# Patient Record
Sex: Female | Born: 1935 | Race: White | Hispanic: No | Marital: Married | Smoking: Never smoker
Health system: Southern US, Community
[De-identification: ages and names within clinical notes are randomized; demographics above are authoritative.]

## PROBLEM LIST (undated history)

## (undated) DIAGNOSIS — E079 Disorder of thyroid, unspecified: Secondary | ICD-10-CM

---

## 2004-11-22 ENCOUNTER — Other Ambulatory Visit: Payer: Self-pay

## 2004-11-22 ENCOUNTER — Inpatient Hospital Stay: Payer: Self-pay | Admitting: Internal Medicine

## 2005-05-08 ENCOUNTER — Ambulatory Visit: Payer: Self-pay | Admitting: Vascular Surgery

## 2005-05-09 ENCOUNTER — Inpatient Hospital Stay: Payer: Self-pay | Admitting: Vascular Surgery

## 2005-10-24 ENCOUNTER — Ambulatory Visit: Payer: Self-pay | Admitting: Internal Medicine

## 2006-01-30 ENCOUNTER — Ambulatory Visit: Payer: Self-pay | Admitting: Internal Medicine

## 2007-12-06 ENCOUNTER — Ambulatory Visit: Payer: Self-pay | Admitting: Internal Medicine

## 2008-05-19 ENCOUNTER — Emergency Department: Payer: Self-pay | Admitting: Unknown Physician Specialty

## 2009-03-04 ENCOUNTER — Ambulatory Visit: Payer: Self-pay | Admitting: Internal Medicine

## 2009-03-11 ENCOUNTER — Ambulatory Visit: Payer: Self-pay | Admitting: Internal Medicine

## 2010-04-18 ENCOUNTER — Emergency Department: Payer: Self-pay | Admitting: Emergency Medicine

## 2010-04-23 ENCOUNTER — Ambulatory Visit: Payer: Self-pay | Admitting: Internal Medicine

## 2010-04-29 ENCOUNTER — Ambulatory Visit: Payer: Self-pay | Admitting: Internal Medicine

## 2011-01-23 ENCOUNTER — Emergency Department: Payer: Self-pay | Admitting: Emergency Medicine

## 2011-03-22 ENCOUNTER — Ambulatory Visit: Payer: Self-pay | Admitting: Internal Medicine

## 2011-12-11 ENCOUNTER — Ambulatory Visit: Payer: Self-pay | Admitting: Internal Medicine

## 2012-05-22 ENCOUNTER — Ambulatory Visit: Payer: Self-pay | Admitting: Internal Medicine

## 2013-05-12 ENCOUNTER — Ambulatory Visit: Payer: Self-pay | Admitting: Family Medicine

## 2018-06-15 ENCOUNTER — Emergency Department: Payer: Medicare Other

## 2018-06-15 ENCOUNTER — Emergency Department
Admission: EM | Admit: 2018-06-15 | Discharge: 2018-06-15 | Disposition: A | Discharge status: Home / Self Care | Payer: Medicare Other | Attending: Emergency Medicine | Admitting: Emergency Medicine

## 2018-06-15 DIAGNOSIS — R0602 Shortness of breath: Secondary | ICD-10-CM

## 2018-06-15 DIAGNOSIS — Z7982 Long term (current) use of aspirin: Secondary | ICD-10-CM | POA: Insufficient documentation

## 2018-06-15 DIAGNOSIS — Z79899 Other long term (current) drug therapy: Secondary | ICD-10-CM | POA: Insufficient documentation

## 2018-06-15 HISTORY — DX: Disorder of thyroid, unspecified: E07.9

## 2018-06-15 LAB — BASIC METABOLIC PANEL
ANION GAP: 8 (ref 5–15)
BUN: 22 mg/dL (ref 8–23)
CO2: 26 mmol/L (ref 22–32)
Calcium: 9.8 mg/dL (ref 8.9–10.3)
Chloride: 106 mmol/L (ref 98–111)
Creatinine, Ser: 1.07 mg/dL — ABNORMAL HIGH (ref 0.44–1.00)
GFR calc Af Amer: 50 mL/min — ABNORMAL LOW (ref >60)
GFR, EST NON AFRICAN AMERICAN: 43 mL/min — AB (ref >60)
Glucose, Bld: 110 mg/dL — ABNORMAL HIGH (ref 70–99)
POTASSIUM: 3.5 mmol/L (ref 3.5–5.1)
SODIUM: 140 mmol/L (ref 135–145)

## 2018-06-15 LAB — CBC
HEMATOCRIT: 32.9 % — AB (ref 35.0–47.0)
HEMOGLOBIN: 11.2 g/dL — AB (ref 12.0–16.0)
MCH: 30.7 pg (ref 26.0–34.0)
MCHC: 33.9 g/dL (ref 32.0–36.0)
MCV: 90.5 fL (ref 80.0–100.0)
Platelets: 260 10*3/uL (ref 150–440)
RBC: 3.64 MIL/uL — ABNORMAL LOW (ref 3.80–5.20)
RDW: 14.3 % (ref 11.5–14.5)
WBC: 5.8 10*3/uL (ref 3.6–11.0)

## 2018-06-15 LAB — TSH: TSH: 3.681 u[IU]/mL (ref 0.350–4.500)

## 2018-06-15 LAB — TROPONIN I
Troponin I: <0.03 ng/mL (ref <0.03)
Troponin I: <0.03 ng/mL (ref <0.03)

## 2018-06-15 MED ORDER — GI COCKTAIL ~~LOC~~
30.0000 mL | Freq: Once | ORAL | Status: AC
  Administered 2018-06-15: 30 mL via ORAL
  Filled 2018-06-15: qty 30

## 2018-06-15 NOTE — Discharge Instructions (Addendum)
Avoid the heat, return to the emergency room for any new or worrisome symptoms.

## 2018-06-15 NOTE — ED Notes (Signed)
Patient states that her heartburn was relieved by the GI Cocktail.

## 2018-06-15 NOTE — ED Provider Notes (Addendum)
[facility] Emergency Department Provider Note  ____________________________________________   I have reviewed the triage vital signs and the nursing notes. Where available I have reviewed prior notes and, if possible and indicated, outside hospital notes.    HISTORY  Chief Complaint Shortness of Breath and Chest Pain    HPI Ann Green is a 84 y.o. female  With a history of thyroid disease, states that she was outside on her porch in the heat, is a very high heat index day, and she became a little overwhelmed.  She had some shortness of breath and felt uncontrolled.  She did not have chest pain she describes to me.  In any event, she came into the house and started feeling better EMS was called and when she got into the ambulance she felt much better and at this time she feels back to normal.  Her strong preference is not to be admitted to the hospital.  She denies any leg swelling, fever chills or pleuritic chest pain  At this time she is resting comfortably and her only desires to be left alone and go home   Past Medical History:  Diagnosis Date  . Thyroid disease     There are no active problems to display for this patient.   History reviewed. No pertinent surgical history.  Prior to Admission medications   Medication Sig Start Date End Date Taking? Authorizing Provider  aspirin 81 MG chewable tablet Chew 81 mg by mouth daily.   Yes [provider]  ferrous sulfate 325 (65 FE) MG tablet Take 325 mg by mouth every morning.   Yes [provider]  furosemide (LASIX) 20 MG tablet Take 10-20 mg by mouth 3 (three) times a week. (as needed for leg swelling) 06/09/18  Yes [provider]  levothyroxine (SYNTHROID, LEVOTHROID) 75 MCG tablet Take 75 mcg by mouth daily. 06/05/18  Yes [provider]  omeprazole (PRILOSEC) 20 MG capsule Take 20 mg by mouth daily. 10/21/17  Yes [provider]  venlafaxine XR  (EFFEXOR-XR) 150 MG 24 hr capsule Take 150 mg by mouth daily. 04/14/18  Yes [provider]  vitamin B-12 (CYANOCOBALAMIN) 1000 MCG tablet Take 1,000 mcg by mouth daily. 10/16/17 10/16/18 Yes [provider]    Allergies Patient has no known allergies.  History reviewed. No pertinent family history.  Social History Social History   Tobacco Use  . Smoking status: Never Smoker  . Smokeless tobacco: Never Used  Substance Use Topics  . Alcohol use: Not Currently  . Drug use: Never    Review of Systems Constitutional: No fever/chills Eyes: No visual changes. ENT: No sore throat. No stiff neck no neck pain Cardiovascular: Denies chest pain. Respiratory: Transitory shortness of breath. Gastrointestinal:   no vomiting.  No diarrhea.  No constipation. Genitourinary: Negative for dysuria. Musculoskeletal: Negative lower extremity swelling Skin: Negative for rash. Neurological: Negative for severe headaches, focal weakness or numbness.   ____________________________________________   PHYSICAL EXAM:  VITAL SIGNS: ED Triage Vitals  Enc Vitals Group     BP 06/15/18 1600 (!) 157/70     Pulse Rate 06/15/18 1600 70     Resp 06/15/18 1600 16     Temp 06/15/18 1553 98.5 F (36.9 C)     Temp src --      SpO2 06/15/18 1600 100 %     Weight 06/15/18 1555 130 lb (59 kg)     Height 06/15/18 1555 5\' 3"  (1.6 m)  Head Circumference --      Peak Flow --      Pain Score 06/15/18 1554 8     Pain Loc --      Pain Edu? --      Excl. in GC? --     Constitutional: Alert and oriented. Well appearing and in no acute distress. Eyes: Conjunctivae are normal Head: Atraumatic HEENT: No congestion/rhinnorhea. Mucous membranes are moist.  Oropharynx non-erythematous Neck:   Nontender with no meningismus, no masses, no stridor Cardiovascular: Normal rate, regular rhythm. Grossly normal heart sounds.  Good peripheral circulation. Respiratory: Normal respiratory effort.  No  retractions. Lungs CTAB. Abdominal: Soft and nontender. No distention. No guarding no rebound Back:  There is no focal tenderness or step off.  there is no midline tenderness there are no lesions noted. there is no CVA tenderness Musculoskeletal: No lower extremity tenderness, no upper extremity tenderness. No joint effusions, no DVT signs strong distal pulses no edema Neurologic:  Normal speech and language. No gross focal neurologic deficits are appreciated.  Skin:  Skin is warm, dry and intact. No rash noted. Psychiatric: Mood and affect are normal. Speech and behavior are normal.  ____________________________________________   LABS (all labs ordered are listed, but only abnormal results are displayed)  Labs Reviewed  BASIC METABOLIC PANEL - Abnormal; Notable for the following components:      Result Value   Glucose, Bld 110 (*)    Creatinine, Ser 1.07 (*)    GFR calc non Af Amer 43 (*)    GFR calc Af Amer 50 (*)    All other components within normal limits  CBC - Abnormal; Notable for the following components:   RBC 3.64 (*)    Hemoglobin 11.2 (*)    HCT 32.9 (*)    All other components within normal limits  TROPONIN I  TSH    Pertinent labs  results that were available during my care of the patient were reviewed by me and considered in my medical decision making (see chart for details). ____________________________________________  EKG  I personally interpreted any EKGs ordered by me or triage Sinus rhythm rate 73 bpm no acute ST elevation or depression normal axis unremarkable EKG ____________________________________________  RADIOLOGY  Pertinent labs & imaging results that were available during my care of the patient were reviewed by me and considered in my medical decision making (see chart for details). If possible, patient and/or family made aware of any abnormal findings.  Dg Chest 2 View  Result Date: 06/15/2018 CLINICAL DATA:  Chest pain and shortness of  breath beginning today. EXAM: CHEST - 2 VIEW COMPARISON:  05/12/2013 FINDINGS: The heart size and mediastinal contours are within normal limits. Aortic atherosclerosis. Pulmonary hyperinflation is again seen, consistent with COPD. New linear opacity is seen in the right lower lung, which may be due to scarring or atelectasis. Old bilateral rib fracture deformities are noted. IMPRESSION: New right basilar scarring versus subsegmental atelectasis. No evidence of pulmonary consolidation or edema. COPD. Electronically Signed   By: Myles Rosenthal M.D.   On: 06/15/2018 16:35   ____________________________________________    PROCEDURES  Procedure(s) performed: None  Procedures  Critical Care performed: None  ____________________________________________   INITIAL IMPRESSION / ASSESSMENT AND PLAN / ED COURSE  Pertinent labs & imaging results that were available during my care of the patient were reviewed by me and considered in my medical decision making (see chart for details).  Patient here after having a transitory episode of feeling  short of breath and somewhat overwhelmed while sitting on a porch in a 95 degree today with a very high heat index.  After being in air conditioning she feels better and she has no other complaints.  EKG blood work are reassuring chest x-ray is reassuring, no evidence of acute pathology noted and her strong preference is to go very low suspicion for PE.  Thing to suggest dissection.  We will send a second troponin as a precaution if that is okay and patient strong preference is unchanged about admission we will discharge  ----------------------------------------- 8:26 PM on 06/15/2018 -----------------------------------------  Remains absolutely symptomatic care with no evidence of ACS PE dissection myocarditis pericarditis, pneumonia pneumothorax intra-abdominal pathology, pericardial effusion or tamponade, he has been relaxing and very patiently waiting to go home.   Patient and family declined admission which is not unreasonable we will discharge with close outpatient follow-up   ____________________________________________   FINAL CLINICAL IMPRESSION(S) / ED DIAGNOSES  Final diagnoses:  None      This chart was dictated using voice recognition software.  Despite best efforts to proofread,  errors can occur which can change meaning.      Jeanmarie PlantMcShane, James A, MD 06/15/18 1756    Jeanmarie PlantMcShane, James A, MD 06/15/18 2027

## 2018-06-15 NOTE — ED Triage Notes (Signed)
Pt presents via EMS c/o SOB and chest pressure starting today.

## 2018-07-15 ENCOUNTER — Emergency Department
Admission: EM | Admit: 2018-07-15 | Discharge: 2018-07-15 | Disposition: A | Discharge status: Home / Self Care | Payer: Medicare Other | Attending: Student in an Organized Health Care Education/Training Program | Admitting: Student in an Organized Health Care Education/Training Program

## 2018-07-15 ENCOUNTER — Encounter: Payer: Self-pay | Admitting: Emergency Medicine

## 2018-07-15 ENCOUNTER — Other Ambulatory Visit: Payer: Self-pay

## 2018-07-15 ENCOUNTER — Emergency Department: Payer: Medicare Other

## 2018-07-15 DIAGNOSIS — Y999 Unspecified external cause status: Secondary | ICD-10-CM | POA: Insufficient documentation

## 2018-07-15 DIAGNOSIS — Y929 Unspecified place or not applicable: Secondary | ICD-10-CM | POA: Diagnosis not present

## 2018-07-15 DIAGNOSIS — S8991XA Unspecified injury of right lower leg, initial encounter: Secondary | ICD-10-CM | POA: Diagnosis present

## 2018-07-15 DIAGNOSIS — Z79899 Other long term (current) drug therapy: Secondary | ICD-10-CM | POA: Diagnosis not present

## 2018-07-15 DIAGNOSIS — Y9301 Activity, walking, marching and hiking: Secondary | ICD-10-CM | POA: Diagnosis not present

## 2018-07-15 DIAGNOSIS — W228XXA Striking against or struck by other objects, initial encounter: Secondary | ICD-10-CM | POA: Diagnosis not present

## 2018-07-15 DIAGNOSIS — Z7982 Long term (current) use of aspirin: Secondary | ICD-10-CM | POA: Diagnosis not present

## 2018-07-15 DIAGNOSIS — Z23 Encounter for immunization: Secondary | ICD-10-CM | POA: Diagnosis not present

## 2018-07-15 DIAGNOSIS — S81811A Laceration without foreign body, right lower leg, initial encounter: Secondary | ICD-10-CM | POA: Insufficient documentation

## 2018-07-15 MED ORDER — LIDOCAINE-EPINEPHRINE-TETRACAINE (LET) SOLUTION
NASAL | Status: AC
  Filled 2018-07-15: qty 3

## 2018-07-15 MED ORDER — ONDANSETRON HCL 4 MG/2ML IJ SOLN
4.0000 mg | Freq: Once | INTRAMUSCULAR | Status: AC
  Administered 2018-07-15: 4 mg via INTRAVENOUS

## 2018-07-15 MED ORDER — TETANUS-DIPHTH-ACELL PERTUSSIS 5-2.5-18.5 LF-MCG/0.5 IM SUSP
0.5000 mL | Freq: Once | INTRAMUSCULAR | Status: AC
  Administered 2018-07-15: 0.5 mL via INTRAMUSCULAR
  Filled 2018-07-15: qty 0.5

## 2018-07-15 MED ORDER — ONDANSETRON HCL 4 MG/2ML IJ SOLN
INTRAMUSCULAR | Status: AC
  Administered 2018-07-15: 4 mg via INTRAVENOUS
  Filled 2018-07-15: qty 2

## 2018-07-15 NOTE — ED Notes (Signed)
MD requesting patient trial ambulation.  Pt became nauseous when sitting on bed,  VORB 4mg  zofran placed and given.  Pt ambulated with minimal assist and steady gait.  MD notified.

## 2018-07-15 NOTE — ED Provider Notes (Signed)
[facility] Emergency Department Provider Note    First MD Initiated Contact with Patient 07/15/18 1203     (approximate)  I have reviewed the triage vital signs and the nursing notes.   HISTORY  Chief Complaint Right foot pain   HPI Jim LikeVera C Hun is a 84 y.o. female presents the ER with chief complaint of right lower leg pain.  Patient had a log that she was carrying of fall and hit the front of her right leg.  She was able to walk at the scene.  No other injury but had a large laceration and significant bleeding.  Denies any chest pain or shortness of breath.  She is on baby aspirin but no other blood thinners.  No numbness or tingling.    Past Medical History:  Diagnosis Date  . Thyroid disease    No family history on file. No past surgical history on file. There are no active problems to display for this patient.     Prior to Admission medications   Medication Sig Start Date End Date Taking? Authorizing Provider  aspirin 81 MG chewable tablet Chew 81 mg by mouth daily.    [provider]  ferrous sulfate 325 (65 FE) MG tablet Take 325 mg by mouth every morning.    [provider]  furosemide (LASIX) 20 MG tablet Take 10-20 mg by mouth 3 (three) times a week. (as needed for leg swelling) 06/09/18   [provider]  levothyroxine (SYNTHROID, LEVOTHROID) 75 MCG tablet Take 75 mcg by mouth daily. 06/05/18   [provider]  omeprazole (PRILOSEC) 20 MG capsule Take 20 mg by mouth daily. 10/21/17   [provider]  venlafaxine XR (EFFEXOR-XR) 150 MG 24 hr capsule Take 150 mg by mouth daily. 04/14/18   [provider]  vitamin B-12 (CYANOCOBALAMIN) 1000 MCG tablet Take 1,000 mcg by mouth daily. 10/16/17 10/16/18  [provider]    Allergies Patient has no known allergies.    Social History Social History   Tobacco Use  . Smoking status: Never Smoker  . Smokeless tobacco: Never Used    Substance Use Topics  . Alcohol use: Not Currently  . Drug use: Never    Review of Systems Patient denies headaches, rhinorrhea, blurry vision, numbness, shortness of breath, chest pain, edema, cough, abdominal pain, nausea, vomiting, diarrhea, dysuria, fevers, rashes or hallucinations unless otherwise stated above in HPI. ____________________________________________   PHYSICAL EXAM:  VITAL SIGNS: There were no vitals filed for this visit.  Constitutional: Alert and oriented.  Eyes: Conjunctivae are normal.  Head: Atraumatic. Nose: No congestion/rhinnorhea. Mouth/Throat: Mucous membranes are moist.   Neck: No stridor. Painless ROM.  Cardiovascular: Normal rate, regular rhythm. Grossly normal heart sounds.  Good peripheral circulation. Respiratory: Normal respiratory effort.  No retractions. Lungs CTAB. Gastrointestinal: Soft and nontender. No distention. No abdominal bruits. No CVA tenderness. Genitourinary: deferred Musculoskeletal: 10cm triangular skin tear to right anterior shin, hemostatic.    No joint effusions. Neurologic:  Normal speech and language. No gross focal neurologic deficits are appreciated. No facial droop Skin:  Skin is warm, dry and intact. No rash noted. Psychiatric: Mood and affect are normal. Speech and behavior are normal.  ____________________________________________   LABS (all labs ordered are listed, but only abnormal results are displayed)  No results found for this or any previous visit (from the past 24 hour(s)). ____________________________________________ ____________________________________________  RADIOLOGY  I personally reviewed all radiographic images ordered to evaluate for the above  acute complaints and reviewed radiology reports and findings.  These findings were personally discussed with the patient.  Please see medical record for radiology report.  ____________________________________________   PROCEDURES  Procedure(s)  performed:  Procedures    Critical Care performed: no ____________________________________________   INITIAL IMPRESSION / ASSESSMENT AND PLAN / ED COURSE  Pertinent labs & imaging results that were available during my care of the patient were reviewed by me and considered in my medical decision making (see chart for details).   DDX: laceration, abrasion, skin tear, fracture  Jim LikeVera C Cerone is a 84 y.o. who presents to the ED with laceration right lower extremity as described above.  She is hemodynamically stable.  No evidence of other associated traumatic injury.  X-ray shows no evidence of foreign body or fracture.  Patient tetanus updated.  Wound dressed with zeroform.  Not amenable to suture repair.        As part of my medical decision making, I reviewed the following data within the electronic MEDICAL RECORD NUMBER Nursing notes reviewed and incorporated, Labs reviewed, notes from prior ED visits.   ____________________________________________   FINAL CLINICAL IMPRESSION(S) / ED DIAGNOSES  Final diagnoses:  Laceration of right lower extremity, initial encounter      NEW MEDICATIONS STARTED DURING THIS VISIT:  New Prescriptions   No medications on file     Note:  This document was prepared using Dragon voice recognition software and may include unintentional dictation errors.    Willy Eddyobinson, Antonis Lor, MD 07/15/18 1255

## 2018-07-15 NOTE — ED Triage Notes (Signed)
Pt to ED via EMS from home c/o laceration to right lower leg.  States was carrying a limb and it fell onto pt's leg.  EMS states large amount of blood loss.  Pt denies dizziness or weakness.  States 81mg  ASA daily.  Given 400mL normal saline en route.

## 2018-07-15 NOTE — ED Notes (Signed)
Pt leg wrapped by MD.

## 2018-07-15 NOTE — ED Notes (Signed)
20g IV placed earlier by EMS was [facility]  Catheter intact, bleeding controlled and dressing placed.

## 2018-11-19 IMAGING — DX DG TIBIA/FIBULA 2V*R*
2 series · 2 of 2 positions shown · non-contrast
Comparison: None.

CLINICAL DATA: Laceration to right lower leg

EXAM:
RIGHT TIBIA AND FIBULA - 2 VIEW

[tibia ap (1 of 2)]
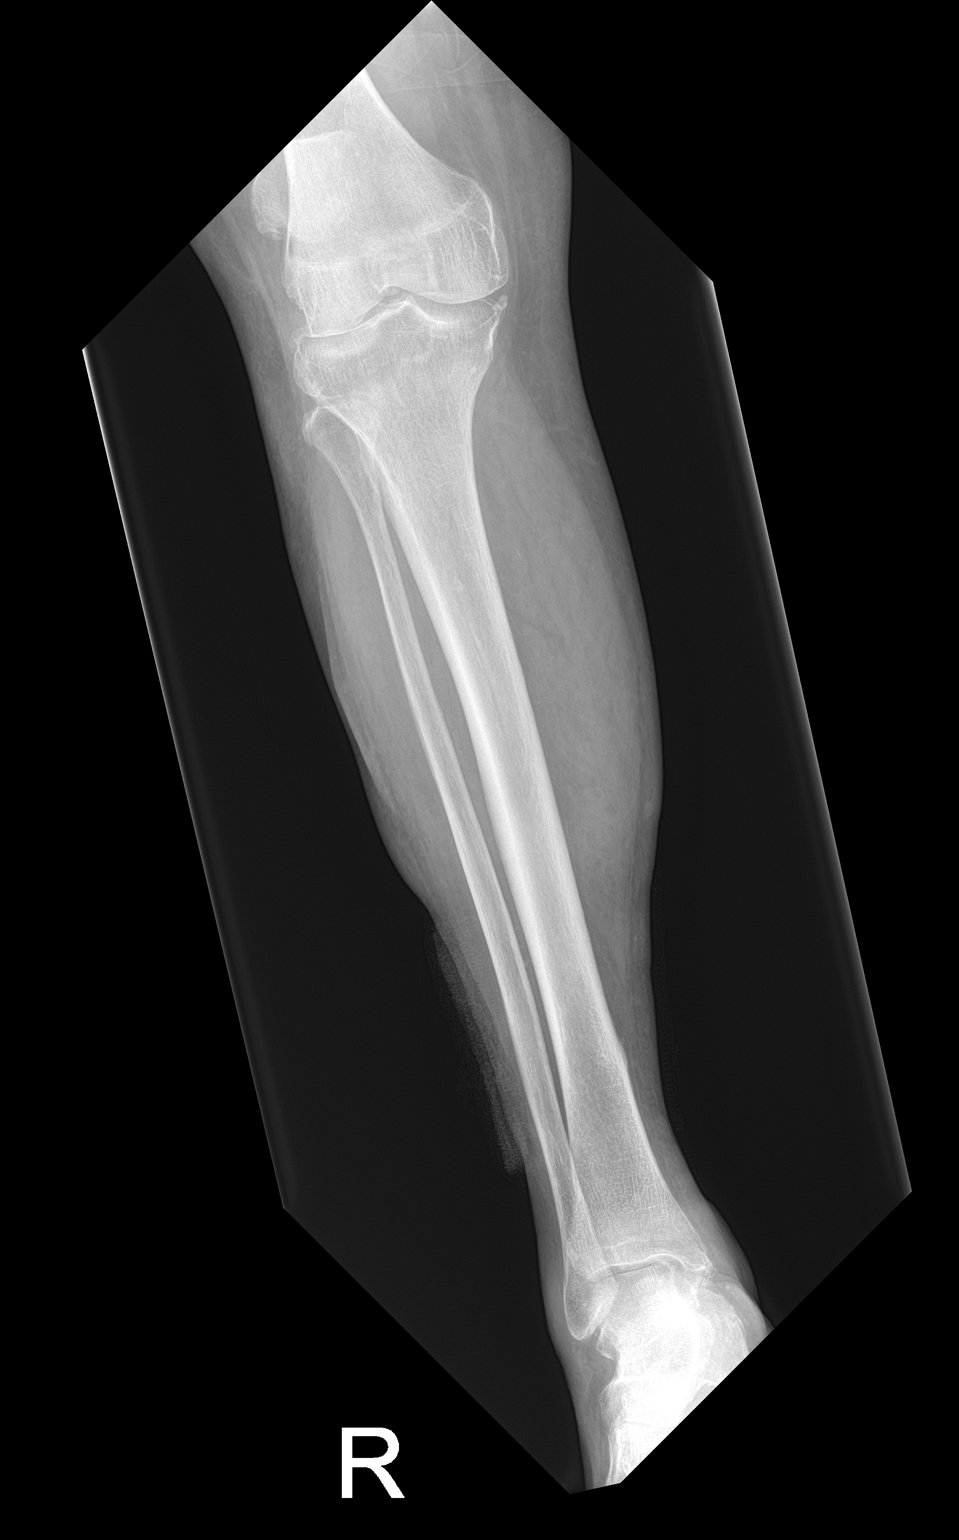

[tibia ap (2 of 2)]
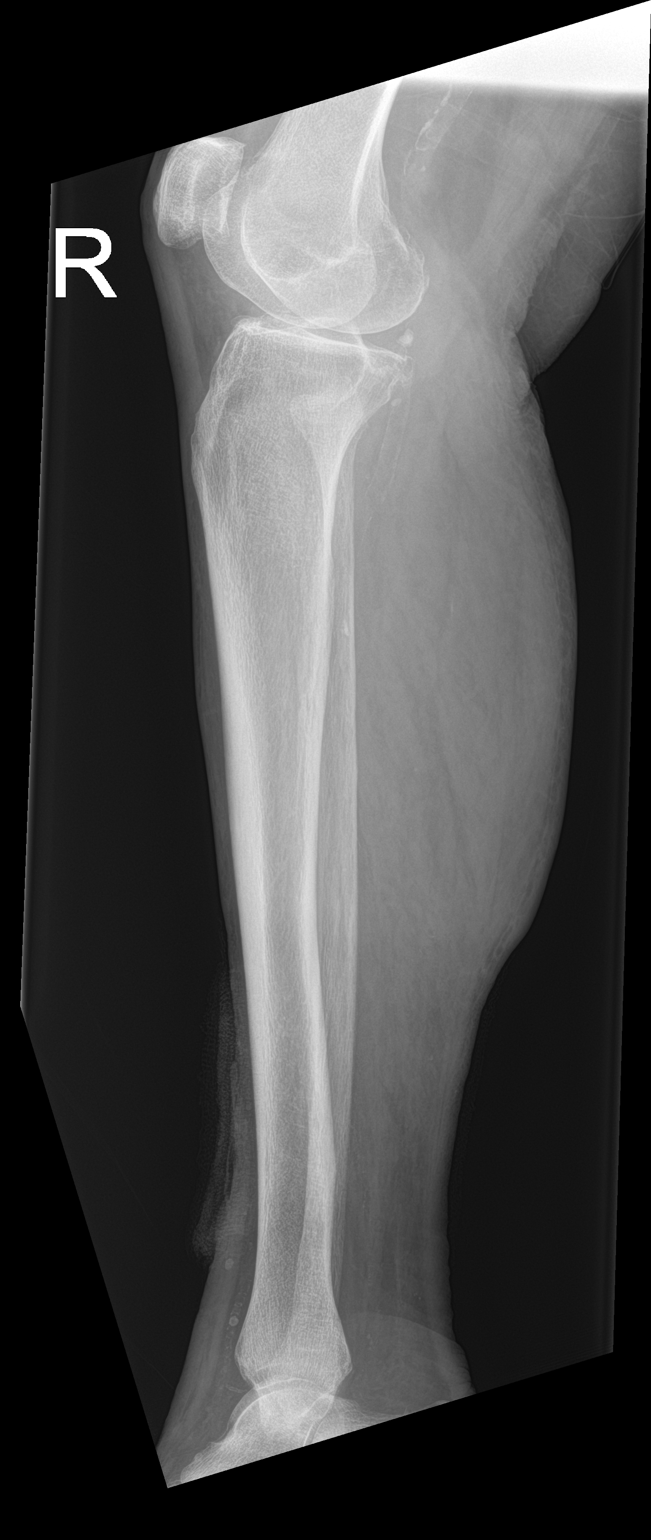

[2 of 2 positions shown; findings below may reference images not displayed]

FINDINGS: Soft tissue laceration present laterally. No underlying bony
abnormality. No fracture, subluxation or dislocation. Early
degenerative changes in the right knee.
IMPRESSION: No acute bony abnormality.

## 2020-05-24 ENCOUNTER — Other Ambulatory Visit: Payer: Self-pay

## 2020-05-24 ENCOUNTER — Encounter: Payer: Self-pay | Admitting: Intensive Care

## 2020-05-24 ENCOUNTER — Emergency Department: Payer: Medicare Other

## 2020-05-24 ENCOUNTER — Emergency Department
Admission: EM | Admit: 2020-05-24 | Discharge: 2020-05-24 | Disposition: A | Discharge status: Home / Self Care | Payer: Medicare Other | Attending: Emergency Medicine | Admitting: Emergency Medicine

## 2020-05-24 DIAGNOSIS — Y939 Activity, unspecified: Secondary | ICD-10-CM | POA: Insufficient documentation

## 2020-05-24 DIAGNOSIS — S0003XA Contusion of scalp, initial encounter: Secondary | ICD-10-CM | POA: Insufficient documentation

## 2020-05-24 DIAGNOSIS — Y999 Unspecified external cause status: Secondary | ICD-10-CM | POA: Diagnosis not present

## 2020-05-24 DIAGNOSIS — W19XXXA Unspecified fall, initial encounter: Secondary | ICD-10-CM | POA: Diagnosis not present

## 2020-05-24 DIAGNOSIS — Y92002 Bathroom of unspecified non-institutional (private) residence single-family (private) house as the place of occurrence of the external cause: Secondary | ICD-10-CM | POA: Diagnosis not present

## 2020-05-24 DIAGNOSIS — M545 Low back pain, unspecified: Secondary | ICD-10-CM

## 2020-05-24 DIAGNOSIS — S0990XA Unspecified injury of head, initial encounter: Secondary | ICD-10-CM | POA: Diagnosis present

## 2020-05-24 LAB — CBC WITH DIFFERENTIAL/PLATELET
Abs Immature Granulocytes: 0.05 10*3/uL (ref 0.00–0.07)
Basophils Absolute: 0 10*3/uL (ref 0.0–0.1)
Basophils Relative: 0 %
Eosinophils Absolute: 0.1 10*3/uL (ref 0.0–0.5)
Eosinophils Relative: 1 %
HCT: 35.2 % — ABNORMAL LOW (ref 36.0–46.0)
Hemoglobin: 11.4 g/dL — ABNORMAL LOW (ref 12.0–15.0)
Immature Granulocytes: 1 %
Lymphocytes Relative: 11 %
Lymphs Abs: 1.1 10*3/uL (ref 0.7–4.0)
MCH: 27.2 pg (ref 26.0–34.0)
MCHC: 32.4 g/dL (ref 30.0–36.0)
MCV: 84 fL (ref 80.0–100.0)
Monocytes Absolute: 0.7 10*3/uL (ref 0.1–1.0)
Monocytes Relative: 8 %
Neutro Abs: 7.6 10*3/uL (ref 1.7–7.7)
Neutrophils Relative %: 79 %
Platelets: 190 10*3/uL (ref 150–400)
RBC: 4.19 MIL/uL (ref 3.87–5.11)
RDW: 14 % (ref 11.5–15.5)
WBC: 9.6 10*3/uL (ref 4.0–10.5)
nRBC: 0 % (ref 0.0–0.2)

## 2020-05-24 LAB — CK: Total CK: 68 U/L (ref 38–234)

## 2020-05-24 LAB — BASIC METABOLIC PANEL
Anion gap: 10 (ref 5–15)
BUN: 28 mg/dL — ABNORMAL HIGH (ref 8–23)
CO2: 26 mmol/L (ref 22–32)
Calcium: 9.4 mg/dL (ref 8.9–10.3)
Chloride: 106 mmol/L (ref 98–111)
Creatinine, Ser: 0.95 mg/dL (ref 0.44–1.00)
GFR calc Af Amer: 59 mL/min — ABNORMAL LOW (ref >60)
GFR calc non Af Amer: 51 mL/min — ABNORMAL LOW (ref >60)
Glucose, Bld: 109 mg/dL — ABNORMAL HIGH (ref 70–99)
Potassium: 4.4 mmol/L (ref 3.5–5.1)
Sodium: 142 mmol/L (ref 135–145)

## 2020-05-24 MED ORDER — ACETAMINOPHEN 325 MG PO TABS
650.0000 mg | ORAL_TABLET | Freq: Once | ORAL | Status: AC
  Administered 2020-05-24: 650 mg via ORAL
  Filled 2020-05-24: qty 2

## 2020-05-24 NOTE — ED Notes (Addendum)
Pt to xray

## 2020-05-24 NOTE — ED Triage Notes (Signed)
Patient reports having mechanical fall in bathroom today. Laid on floor about three hours before her son found her and called EMS. Has hematoma present to right side of head. C/o back pain due to fall. Denies LOC.

## 2020-05-24 NOTE — ED Triage Notes (Signed)
Pt comes into the ED via EMS from home, states she tripped and fell in the BR, Denies LOC, c/o back pain 9/10. VSS. 174/73,temp 98.1, 100%RA, HR 76. A/ox4

## 2020-05-24 NOTE — ED Provider Notes (Signed)
ER Provider Note       Time seen: 7:08 PM   I have reviewed the vital signs and the nursing notes.  HISTORY   Chief Complaint Fall   HPI Ann Green is a 86 y.o. female with a history of thyroid disease who presents today for mechanical fall in the bathroom today.  Patient laid on the floor for about 3 hours before her son found her and called EMS.  Patient presents with a hematoma on the scalp, complains of back pain due to the fall.  She is complaining of back pain 9 out of 10.  Past Medical History:  Diagnosis Date   Thyroid disease     History reviewed. No pertinent surgical history.  Allergies Patient has no known allergies.  Review of Systems Constitutional: Negative for fever. Cardiovascular: Negative for chest pain. Respiratory: Negative for shortness of breath. Gastrointestinal: Negative for abdominal pain, vomiting and diarrhea. Musculoskeletal: Positive for back pain Skin: Negative for rash. Neurological: Positive for headache  All systems negative/normal/unremarkable except as stated in the HPI  ____________________________________________   PHYSICAL EXAM:  VITAL SIGNS: Vitals:   05/24/20 1642  BP: (!) 187/66  Pulse: 71  Resp: 18  Temp: 98.6 F (37 C)  SpO2: 98%    Constitutional:Well appearing and in no distress. Eyes: Conjunctivae are normal. Normal extraocular movements. ENT      Head: Normocephalic, right zygomatic and periorbital contusion is noted      Nose: No congestion/rhinnorhea.      Mouth/Throat: Mucous membranes are moist.      Neck: No stridor. Cardiovascular: Normal rate, regular rhythm. No murmurs, rubs, or gallops. Respiratory: Normal respiratory effort without tachypnea nor retractions. Breath sounds are clear and equal bilaterally. No wheezes/rales/rhonchi. Gastrointestinal: Soft and nontender. Normal bowel sounds Musculoskeletal: No lower extremity tenderness nor edema. Neurologic:  No gross focal neurologic  deficits are appreciated.  Skin:  Skin is warm, dry and intact. No rash noted. ____________________________________________  EKG: Interpreted by me.  Sinus rhythm with rate of 73 bpm, normal PR interval, normal QRS, normal QT  ____________________________________________   LABS (pertinent positives/negatives)  Labs Reviewed  BASIC METABOLIC PANEL - Abnormal; Notable for the following components:      Result Value   Glucose, Bld 109 (*)    BUN 28 (*)    GFR calc non Af Amer 51 (*)    GFR calc Af Amer 59 (*)    All other components within normal limits  CBC WITH DIFFERENTIAL/PLATELET - Abnormal; Notable for the following components:   Hemoglobin 11.4 (*)    HCT 35.2 (*)    All other components within normal limits  CK  URINALYSIS, COMPLETE (UACMP) WITH MICROSCOPIC    RADIOLOGY  Images were viewed by me IMPRESSION: No facial or orbital fracture. IMPRESSION: Old bilateral basal ganglia lacunar infarcts.  Atrophy, chronic microvascular disease.  No acute intracranial abnormality.  IMPRESSION: Degenerative disc and facet disease.  No acute bony abnormality.  DIFFERENTIAL DIAGNOSIS  Fall, contusion, fracture, arrhythmia, dehydration, electrolyte abnormality  ASSESSMENT AND PLAN  Fall, minor head injury, low back pain   Plan: The patient had presented for a fall. Patient's labs are grossly unremarkable.  Initial imaging regarding CT of the head face and neck was unremarkable.  Due to her complaints of back pain we obtain imaging of the thoracic and lumbar spine which did not reveal any acute process.  She is cleared for outpatient follow-up with her doctor.  Daryel November MD  Note: This note was generated in part or whole with voice recognition software. Voice recognition is usually quite accurate but there are transcription errors that can and very often do occur. I apologize for any typographical errors that were not detected and corrected.     Emily Filbert, MD 05/24/20 2019

## 2020-05-24 NOTE — ED Notes (Signed)
Pt resting on stretcher with family at the bedside. Alert and calm at this time. Provided for comfort and safety. Will continue to assess.

## 2020-05-24 NOTE — ED Notes (Signed)
Dr. Mayford Knife at the bedside to discuss results.

## 2020-05-24 NOTE — ED Notes (Signed)
Rainbow sent to the lab.  

## 2020-05-24 NOTE — ED Notes (Signed)
Attempted to contact family: Clyda Greener, unsuccessful at this time. Son, Sherlynn Carbon aware that pt is here and reports that Merry Proud is to be contacted for discharge of pt.

## 2020-09-28 IMAGING — CT CT MAXILLOFACIAL W/O CM
3 series · 16 of 47 positions shown, 19 images · non-contrast
Comparison: None.

CLINICAL DATA: Fall

EXAM:
CT MAXILLOFACIAL WITHOUT CONTRAST
TECHNIQUE: Multidetector CT imaging of the maxillofacial structures was
performed. Multiplanar CT image reconstructions were also generated.

[Series 2: max soft · axial · 0.33mm/px · z∈[-251,-125]mm · 10 of 73 slices shown, 13 images]
[im 5/73  brain]
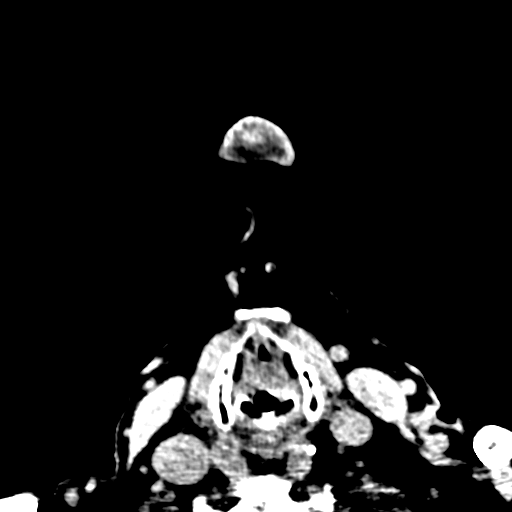
[im 5/73  bone]
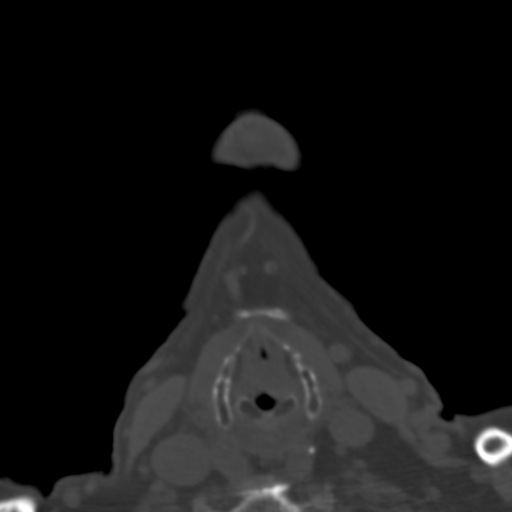
[im 13/73  bone]
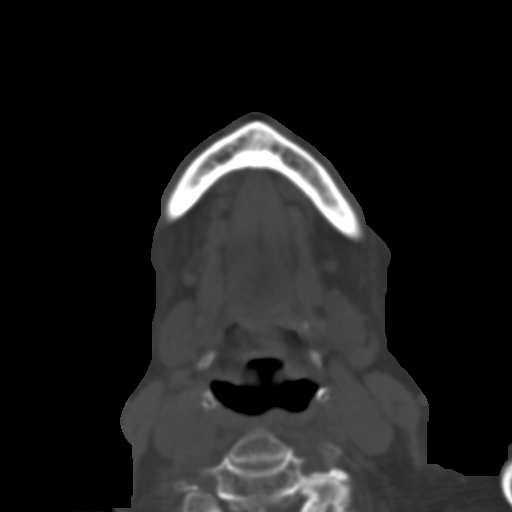
[im 20/73  bone]
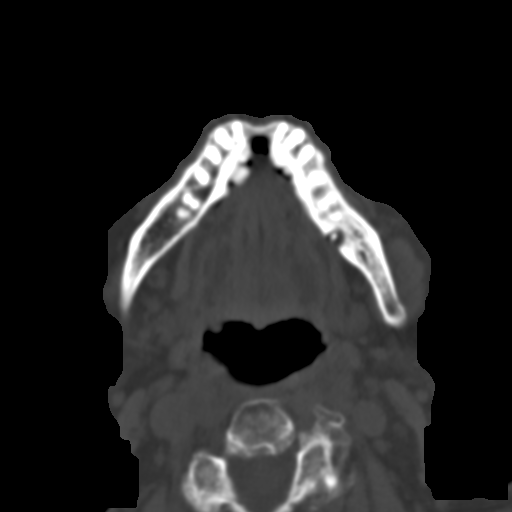
[im 25/73  bone]
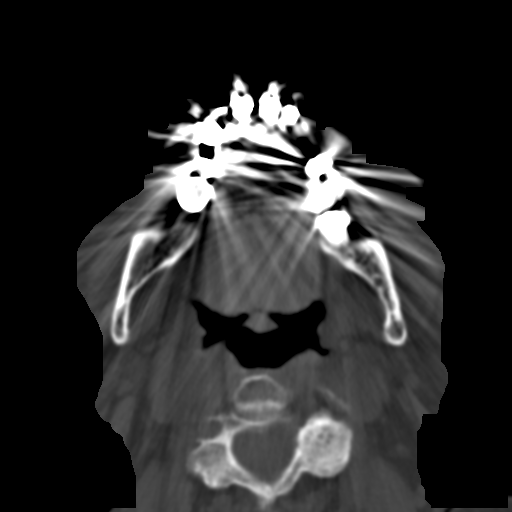
[im 33/73  brain]
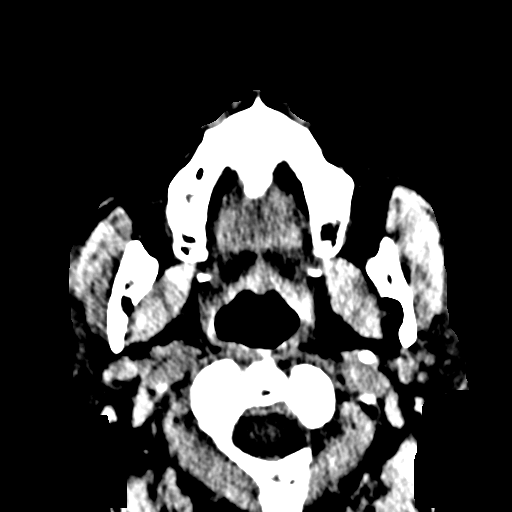
[im 33/73  bone]
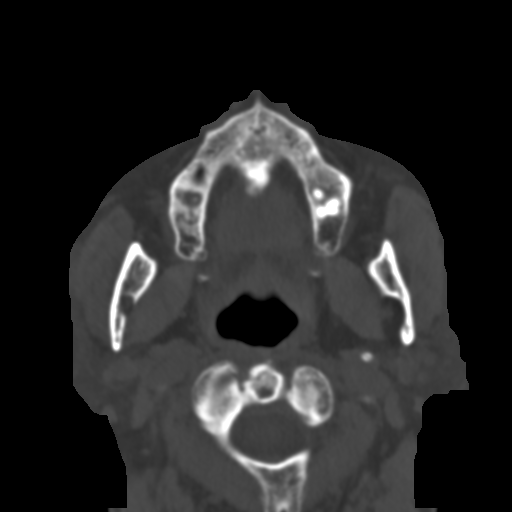
[im 40/73  bone]
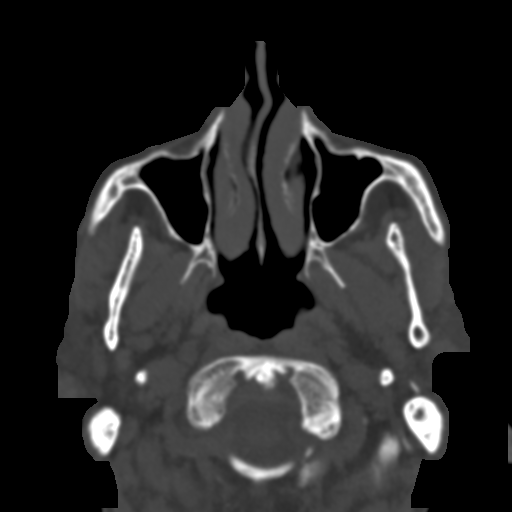
[im 48/73  bone]
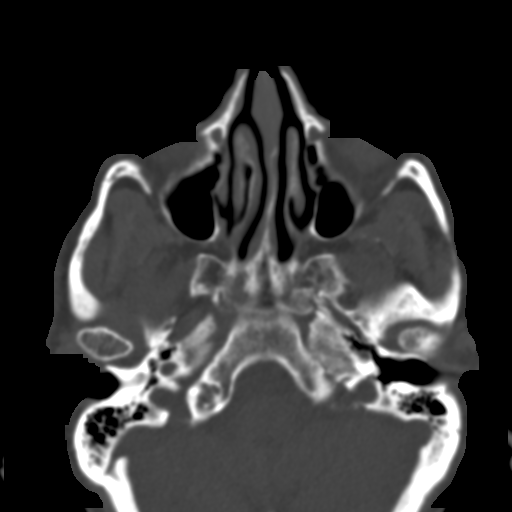
[im 55/73  bone]
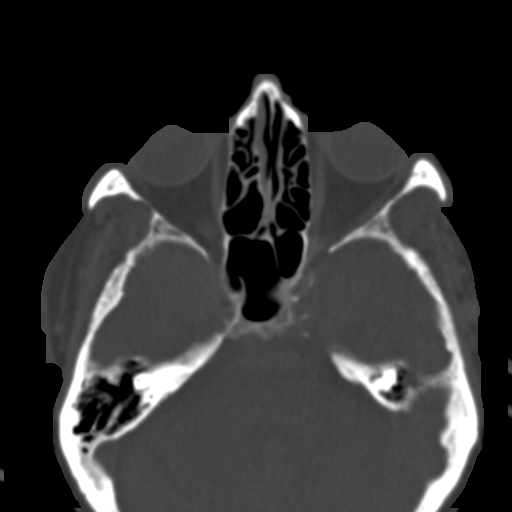
[im 60/73  brain]
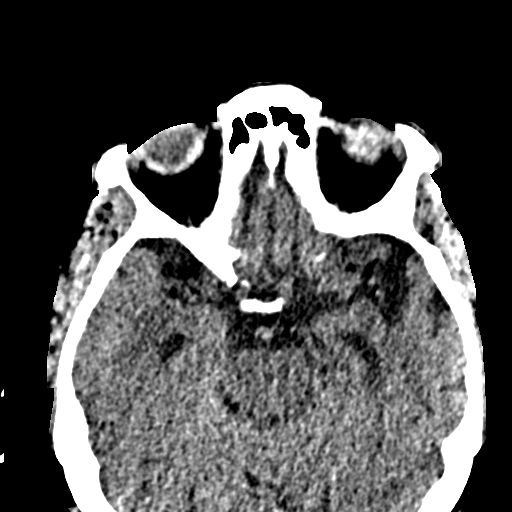
[im 60/73  bone]
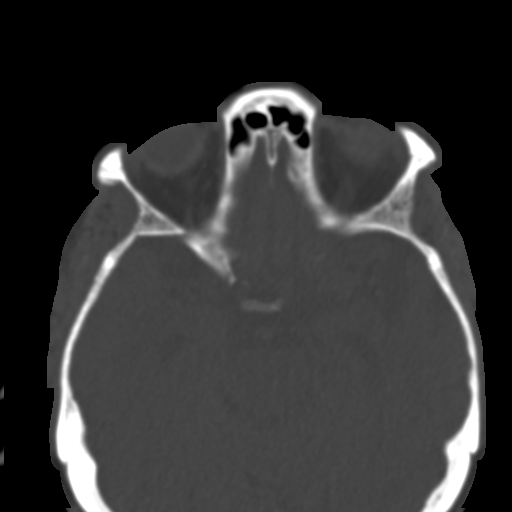
[im 68/73  bone]
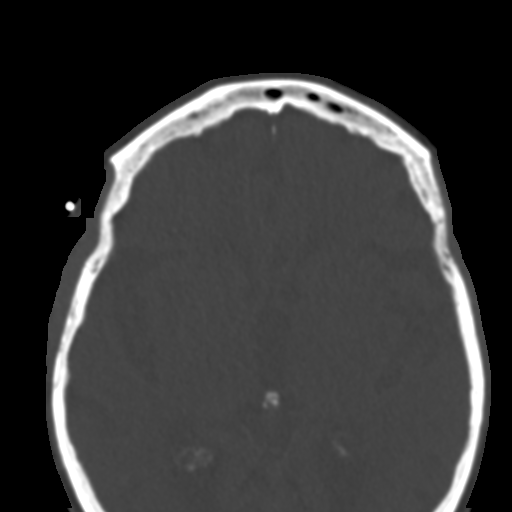

[Series 6: coronal soft · coronal · 0.31mm/px · 3 of 79 slices shown]
[im 27/79  bone]
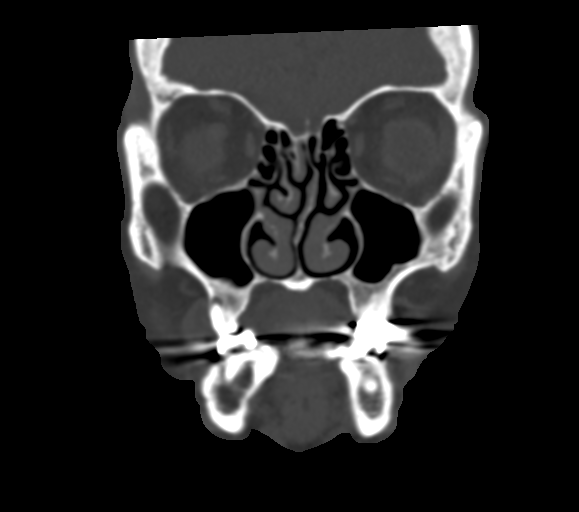
[im 35/79  bone]
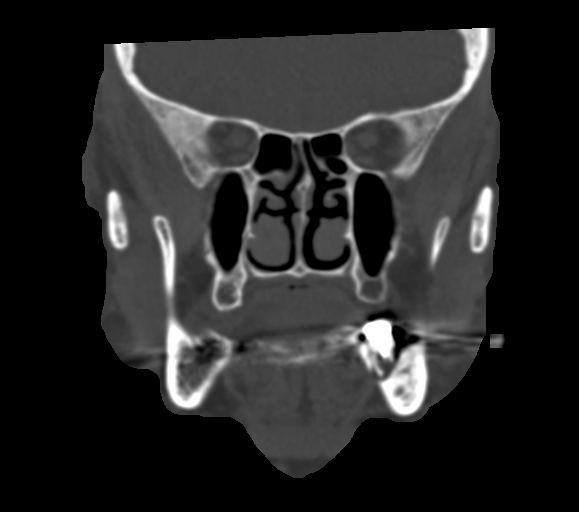
[im 44/79  bone]
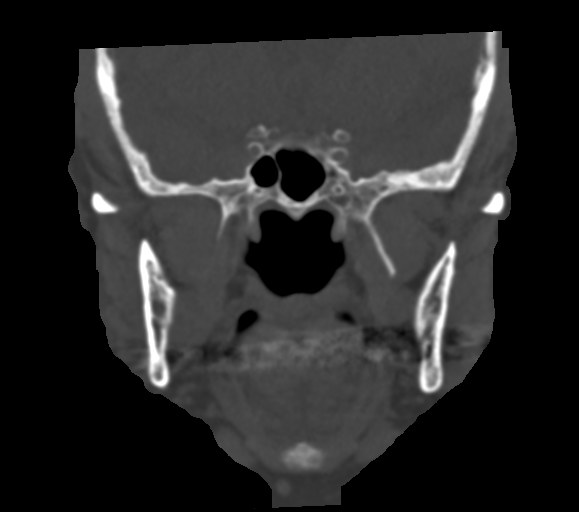

[Series 7: sagittal soft · sagittal · 0.32mm/px · 3 of 77 slices shown]
[im 26/77  bone]
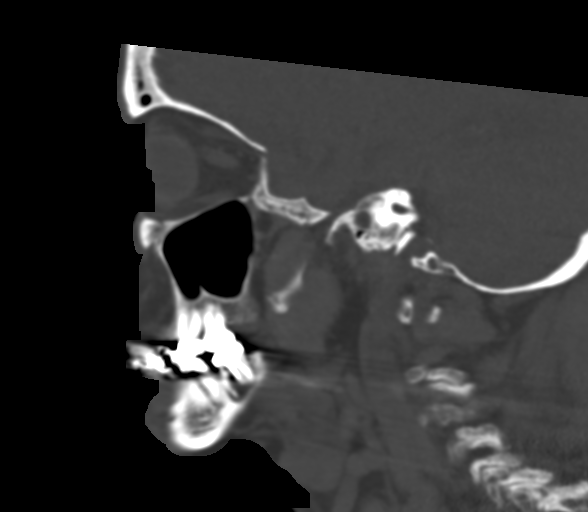
[im 39/77  bone]
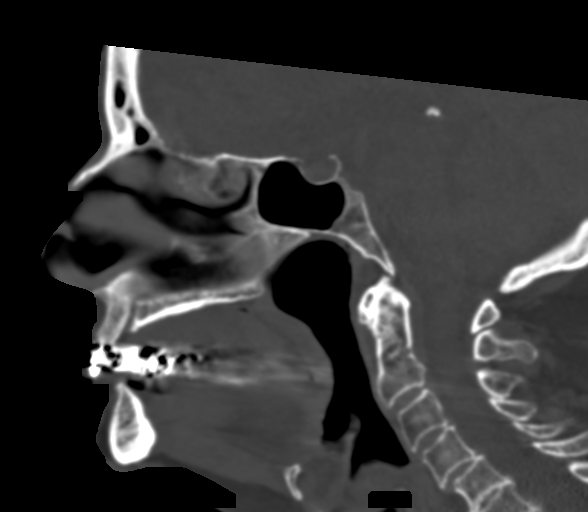
[im 51/77  bone]
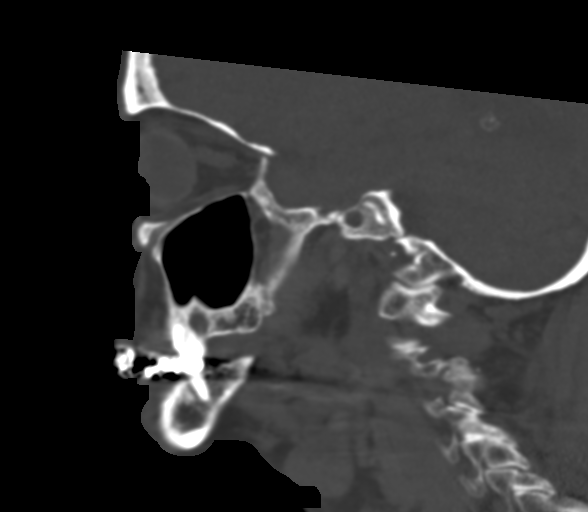

[16 of 47 positions shown; findings below may reference images not displayed]

FINDINGS: Osseous: No fracture or mandibular dislocation. No destructive
process.

Orbits: Negative. No traumatic or inflammatory finding.

Sinuses: Clear

Soft tissues: Negative

Limited intracranial: No acute findings
IMPRESSION: No facial or orbital fracture.

## 2022-11-10 ENCOUNTER — Emergency Department: Payer: Medicare Other

## 2022-11-10 ENCOUNTER — Inpatient Hospital Stay
Admission: EM | Admit: 2022-11-10 | Discharge: 2022-11-27 | DRG: 689 | Disposition: A | Discharge status: Skilled Nursing Facility | Payer: Medicare Other | Attending: Internal Medicine | Admitting: Internal Medicine

## 2022-11-10 ENCOUNTER — Encounter: Payer: Self-pay | Admitting: Internal Medicine

## 2022-11-10 ENCOUNTER — Other Ambulatory Visit: Payer: Self-pay

## 2022-11-10 DIAGNOSIS — E872 Acidosis, unspecified: Secondary | ICD-10-CM | POA: Diagnosis not present

## 2022-11-10 DIAGNOSIS — N390 Urinary tract infection, site not specified: Secondary | ICD-10-CM | POA: Diagnosis not present

## 2022-11-10 DIAGNOSIS — I10 Essential (primary) hypertension: Secondary | ICD-10-CM | POA: Insufficient documentation

## 2022-11-10 DIAGNOSIS — Z79890 Hormone replacement therapy (postmenopausal): Secondary | ICD-10-CM

## 2022-11-10 DIAGNOSIS — R4781 Slurred speech: Secondary | ICD-10-CM | POA: Diagnosis present

## 2022-11-10 DIAGNOSIS — D649 Anemia, unspecified: Secondary | ICD-10-CM | POA: Diagnosis not present

## 2022-11-10 DIAGNOSIS — L0390 Cellulitis, unspecified: Secondary | ICD-10-CM | POA: Insufficient documentation

## 2022-11-10 DIAGNOSIS — E785 Hyperlipidemia, unspecified: Secondary | ICD-10-CM | POA: Insufficient documentation

## 2022-11-10 DIAGNOSIS — Z66 Do not resuscitate: Secondary | ICD-10-CM | POA: Diagnosis present

## 2022-11-10 DIAGNOSIS — R7989 Other specified abnormal findings of blood chemistry: Secondary | ICD-10-CM | POA: Diagnosis not present

## 2022-11-10 DIAGNOSIS — G9341 Metabolic encephalopathy: Secondary | ICD-10-CM | POA: Insufficient documentation

## 2022-11-10 DIAGNOSIS — L03116 Cellulitis of left lower limb: Secondary | ICD-10-CM | POA: Diagnosis not present

## 2022-11-10 DIAGNOSIS — E079 Disorder of thyroid, unspecified: Secondary | ICD-10-CM

## 2022-11-10 DIAGNOSIS — N179 Acute kidney failure, unspecified: Secondary | ICD-10-CM | POA: Diagnosis not present

## 2022-11-10 DIAGNOSIS — R0789 Other chest pain: Secondary | ICD-10-CM | POA: Diagnosis present

## 2022-11-10 DIAGNOSIS — R001 Bradycardia, unspecified: Secondary | ICD-10-CM | POA: Diagnosis present

## 2022-11-10 DIAGNOSIS — I129 Hypertensive chronic kidney disease with stage 1 through stage 4 chronic kidney disease, or unspecified chronic kidney disease: Secondary | ICD-10-CM | POA: Diagnosis present

## 2022-11-10 DIAGNOSIS — F03918 Unspecified dementia, unspecified severity, with other behavioral disturbance: Secondary | ICD-10-CM | POA: Diagnosis present

## 2022-11-10 DIAGNOSIS — E8809 Other disorders of plasma-protein metabolism, not elsewhere classified: Secondary | ICD-10-CM | POA: Diagnosis not present

## 2022-11-10 DIAGNOSIS — Z597 Insufficient social insurance and welfare support: Secondary | ICD-10-CM

## 2022-11-10 DIAGNOSIS — N3000 Acute cystitis without hematuria: Principal | ICD-10-CM | POA: Diagnosis present

## 2022-11-10 DIAGNOSIS — Z7982 Long term (current) use of aspirin: Secondary | ICD-10-CM

## 2022-11-10 DIAGNOSIS — R531 Weakness: Secondary | ICD-10-CM

## 2022-11-10 DIAGNOSIS — Z751 Person awaiting admission to adequate facility elsewhere: Secondary | ICD-10-CM

## 2022-11-10 DIAGNOSIS — Z885 Allergy status to narcotic agent status: Secondary | ICD-10-CM

## 2022-11-10 DIAGNOSIS — R051 Acute cough: Secondary | ICD-10-CM | POA: Diagnosis not present

## 2022-11-10 DIAGNOSIS — E860 Dehydration: Secondary | ICD-10-CM | POA: Diagnosis not present

## 2022-11-10 DIAGNOSIS — E039 Hypothyroidism, unspecified: Secondary | ICD-10-CM | POA: Diagnosis present

## 2022-11-10 DIAGNOSIS — Z79899 Other long term (current) drug therapy: Secondary | ICD-10-CM

## 2022-11-10 DIAGNOSIS — N1831 Chronic kidney disease, stage 3a: Secondary | ICD-10-CM | POA: Diagnosis present

## 2022-11-10 DIAGNOSIS — Z23 Encounter for immunization: Secondary | ICD-10-CM

## 2022-11-10 DIAGNOSIS — Z1152 Encounter for screening for COVID-19: Secondary | ICD-10-CM

## 2022-11-10 LAB — COMPREHENSIVE METABOLIC PANEL
ALT: 10 U/L (ref 0–44)
AST: 17 U/L (ref 15–41)
Albumin: 3.2 g/dL — ABNORMAL LOW (ref 3.5–5.0)
Alkaline Phosphatase: 67 U/L (ref 38–126)
Anion gap: 7 (ref 5–15)
BUN: 21 mg/dL (ref 8–23)
CO2: 23 mmol/L (ref 22–32)
Calcium: 8.3 mg/dL — ABNORMAL LOW (ref 8.9–10.3)
Chloride: 109 mmol/L (ref 98–111)
Creatinine, Ser: 0.84 mg/dL (ref 0.44–1.00)
GFR, Estimated: >60 mL/min (ref >60)
Glucose, Bld: 90 mg/dL (ref 70–99)
Potassium: 3.9 mmol/L (ref 3.5–5.1)
Sodium: 139 mmol/L (ref 135–145)
Total Bilirubin: 0.7 mg/dL (ref 0.3–1.2)
Total Protein: 6.3 g/dL — ABNORMAL LOW (ref 6.5–8.1)

## 2022-11-10 LAB — CBC WITH DIFFERENTIAL/PLATELET
Abs Immature Granulocytes: 0.03 10*3/uL (ref 0.00–0.07)
Basophils Absolute: 0 10*3/uL (ref 0.0–0.1)
Basophils Relative: 1 %
Eosinophils Absolute: 0.2 10*3/uL (ref 0.0–0.5)
Eosinophils Relative: 3 %
HCT: 33.1 % — ABNORMAL LOW (ref 36.0–46.0)
Hemoglobin: 9.8 g/dL — ABNORMAL LOW (ref 12.0–15.0)
Immature Granulocytes: 1 %
Lymphocytes Relative: 19 %
Lymphs Abs: 1.2 10*3/uL (ref 0.7–4.0)
MCH: 26.6 pg (ref 26.0–34.0)
MCHC: 29.6 g/dL — ABNORMAL LOW (ref 30.0–36.0)
MCV: 89.7 fL (ref 80.0–100.0)
Monocytes Absolute: 0.6 10*3/uL (ref 0.1–1.0)
Monocytes Relative: 9 %
Neutro Abs: 4 10*3/uL (ref 1.7–7.7)
Neutrophils Relative %: 67 %
Platelets: 194 10*3/uL (ref 150–400)
RBC: 3.69 MIL/uL — ABNORMAL LOW (ref 3.87–5.11)
RDW: 20.6 % — ABNORMAL HIGH (ref 11.5–15.5)
WBC: 6 10*3/uL (ref 4.0–10.5)
nRBC: 0 % (ref 0.0–0.2)

## 2022-11-10 LAB — TSH: TSH: 0.443 u[IU]/mL (ref 0.350–4.500)

## 2022-11-10 LAB — RESP PANEL BY RT-PCR (RSV, FLU A&B, COVID)  RVPGX2
Influenza A by PCR: NEGATIVE
Influenza B by PCR: NEGATIVE
Resp Syncytial Virus by PCR: NEGATIVE
SARS Coronavirus 2 by RT PCR: NEGATIVE

## 2022-11-10 MED ORDER — SODIUM CHLORIDE 0.9 % IV SOLN: INTRAVENOUS | Status: DC

## 2022-11-10 MED ORDER — DOCUSATE SODIUM 100 MG PO CAPS
100.0000 mg | ORAL_CAPSULE | Freq: Two times a day (BID) | ORAL | Status: DC
  Administered 2022-11-10 – 2022-11-26 (×30): 100 mg via ORAL
  Filled 2022-11-10 (×32): qty 1

## 2022-11-10 MED ORDER — HYDRALAZINE HCL 20 MG/ML IJ SOLN
5.0000 mg | Freq: Four times a day (QID) | INTRAMUSCULAR | Status: DC | PRN
  Administered 2022-11-10 – 2022-11-26 (×3): 5 mg via INTRAVENOUS
  Filled 2022-11-10 (×3): qty 1

## 2022-11-10 MED ORDER — ONDANSETRON HCL 4 MG PO TABS
4.0000 mg | ORAL_TABLET | Freq: Four times a day (QID) | ORAL | Status: DC | PRN
  Administered 2022-11-13: 4 mg via ORAL
  Filled 2022-11-10 (×2): qty 1

## 2022-11-10 MED ORDER — VITAMIN D 25 MCG (1000 UNIT) PO TABS
1000.0000 [IU] | ORAL_TABLET | Freq: Every day | ORAL | Status: DC
  Administered 2022-11-10 – 2022-11-27 (×18): 1000 [IU] via ORAL
  Filled 2022-11-10 (×18): qty 1

## 2022-11-10 MED ORDER — ACETAMINOPHEN 325 MG PO TABS
650.0000 mg | ORAL_TABLET | Freq: Four times a day (QID) | ORAL | Status: DC | PRN
  Administered 2022-11-13 – 2022-11-14 (×3): 650 mg via ORAL
  Filled 2022-11-10 (×3): qty 2

## 2022-11-10 MED ORDER — VENLAFAXINE HCL ER 75 MG PO CP24
150.0000 mg | ORAL_CAPSULE | Freq: Every day | ORAL | Status: DC
  Administered 2022-11-10 – 2022-11-27 (×18): 150 mg via ORAL
  Filled 2022-11-10 (×14): qty 2
  Filled 2022-11-10: qty 1
  Filled 2022-11-10 (×4): qty 2

## 2022-11-10 MED ORDER — ASPIRIN 81 MG PO CHEW
81.0000 mg | CHEWABLE_TABLET | Freq: Every day | ORAL | Status: DC
  Administered 2022-11-10 – 2022-11-27 (×18): 81 mg via ORAL
  Filled 2022-11-10 (×18): qty 1

## 2022-11-10 MED ORDER — LEVOTHYROXINE SODIUM 50 MCG PO TABS
75.0000 ug | ORAL_TABLET | Freq: Every day | ORAL | Status: DC
  Administered 2022-11-10 – 2022-11-27 (×15): 75 ug via ORAL
  Filled 2022-11-10 (×17): qty 1

## 2022-11-10 MED ORDER — PANTOPRAZOLE SODIUM 40 MG PO TBEC
40.0000 mg | DELAYED_RELEASE_TABLET | Freq: Every day | ORAL | Status: DC
  Administered 2022-11-10 – 2022-11-27 (×18): 40 mg via ORAL
  Filled 2022-11-10 (×18): qty 1

## 2022-11-10 MED ORDER — ONDANSETRON HCL 4 MG/2ML IJ SOLN: 4.0000 mg | Freq: Four times a day (QID) | INTRAMUSCULAR | Status: DC | PRN

## 2022-11-10 MED ORDER — SODIUM CHLORIDE 0.9 % IV SOLN
1.0000 g | INTRAVENOUS | Status: DC
  Administered 2022-11-11 – 2022-11-12 (×2): 1 g via INTRAVENOUS
  Filled 2022-11-10: qty 1
  Filled 2022-11-10 (×2): qty 10

## 2022-11-10 MED ORDER — FERROUS SULFATE 325 (65 FE) MG PO TABS
325.0000 mg | ORAL_TABLET | Freq: Every day | ORAL | Status: DC
  Administered 2022-11-10 – 2022-11-27 (×18): 325 mg via ORAL
  Filled 2022-11-10 (×19): qty 1

## 2022-11-10 MED ORDER — ENOXAPARIN SODIUM 40 MG/0.4ML IJ SOSY
40.0000 mg | PREFILLED_SYRINGE | INTRAMUSCULAR | Status: DC
  Administered 2022-11-10 – 2022-11-11 (×2): 40 mg via SUBCUTANEOUS
  Filled 2022-11-10 (×2): qty 0.4

## 2022-11-10 MED ORDER — VENLAFAXINE HCL ER 75 MG PO CP24
75.0000 mg | ORAL_CAPSULE | Freq: Every day | ORAL | Status: DC
  Administered 2022-11-10 – 2022-11-27 (×18): 75 mg via ORAL
  Filled 2022-11-10 (×20): qty 1

## 2022-11-10 MED ORDER — SODIUM CHLORIDE 0.9 % IV SOLN
2.0000 g | Freq: Once | INTRAVENOUS | Status: AC
  Administered 2022-11-10: 2 g via INTRAVENOUS
  Filled 2022-11-10: qty 20

## 2022-11-10 NOTE — ED Notes (Signed)
This RN changed patient, diaper in place soaked in foul smelling urine. Purewick placed and clean brief.

## 2022-11-10 NOTE — ED Provider Notes (Signed)
[facility] Provider Note    Event Date/Time   First MD Initiated Contact with Patient 11/10/22 1544     (approximate)   History   AMS   HPI  Ann Green is a 88 y.o. female with dementia who comes in with decreased mental status and concerns for UTI.  Patient was given 500 mL of lactated Ringer's by EMS.  Patient was seen by a telemedicine doctor yesterday and was started on a Macrobid for UTI.  They report that she took 1 dose of the Macrobid.  They report this morning around noon that she was not really acting her normal self.  She seemed more confused and will need.  They were concerned that it was more on the left side.  They report that she is now back to baseline self.  She denies any chest pain, abdominal pain or other concerns.   Physical Exam   Triage Vital Signs: ED Triage Vitals  Enc Vitals Group     BP 11/10/22 1330 (!) 198/67     Pulse Rate 11/10/22 1330 (!) 56     Resp 11/10/22 1330 18     Temp 11/10/22 1330 97.6 F (36.4 C)     Temp src --      SpO2 11/10/22 1330 98 %     Weight --      Height --      Head Circumference --      Peak Flow --      Pain Score 11/10/22 1331 0     Pain Loc --      Pain Edu? --      Excl. in GC? --     Most recent vital signs: Vitals:   11/10/22 1330  BP: (!) 198/67  Pulse: (!) 56  Resp: 18  Temp: 97.6 F (36.4 C)  SpO2: 98%     General: Awake, no distress.  CV:  Good peripheral perfusion.  Resp:  Normal effort.  Abd:  No distention.  Soft and nontender Other:  Equal strength in arms and legs.  She does have some increasing warmth and redness noted to the left foot.  Pulses noted bilaterally marked on the right given foot felt cooler.  Cranial nerves appear intact  ED Results / Procedures / Treatments   Labs (all labs ordered are listed, but only abnormal results are displayed) Labs Reviewed  CBC WITH DIFFERENTIAL/PLATELET - Abnormal; Notable for the following components:       Result Value   RBC 3.69 (*)    Hemoglobin 9.8 (*)    HCT 33.1 (*)    MCHC 29.6 (*)    RDW 20.6 (*)    All other components within normal limits  COMPREHENSIVE METABOLIC PANEL - Abnormal; Notable for the following components:   Calcium 8.3 (*)    Total Protein 6.3 (*)    Albumin 3.2 (*)    All other components within normal limits     RADIOLOGY I have reviewed the CT head personally interpreted and no evidence of intracranial hemorrhage PROCEDURES:  Critical Care performed: No  Procedures   MEDICATIONS ORDERED IN ED: Medications  cefTRIAXone (ROCEPHIN) 2 g in sodium chloride 0.9 % 100 mL IVPB (has no administration in time range)     IMPRESSION / MDM / ASSESSMENT AND PLAN / ED COURSE  I reviewed the triage vital signs and the nursing notes.   Patient's presentation is most consistent with acute presentation with potential threat to life  or bodily function.  Differential: TIA, Stroke, hemorrhagic stroke, electrolyte abnormality, cellulitis, Uti.    Patient's hemoglobin is 9.8.  This is down from March 2023 where he her hemoglobin was 11.9  CMP is reassuring  I reviewed her UA with her with 16 WBCs and many bacteria   CT no ICH  Patient is left leg looks consistent with cellulitis.  Will treat with ceftriaxone for cellulitis and UTI.  At this time patient is at baseline self I do not see any obvious evidence of large stroke we discussed MRI but at this time they are going to continue to think about it.  Will discuss hospital team for admission.     FINAL CLINICAL IMPRESSION(S) / ED DIAGNOSES   Final diagnoses:  Acute cystitis without hematuria  Weakness  Left leg cellulitis     Rx / DC Orders   ED Discharge Orders     None        Note:  This document was prepared using Dragon voice recognition software and may include unintentional dictation errors.   Concha Se, MD 11/10/22 [phone]

## 2022-11-10 NOTE — ED Notes (Signed)
First Nurse note: home, dementia, decreased mental status and UTI, LR, VSS

## 2022-11-10 NOTE — Hospital Course (Addendum)
Ann Green is a 88 y.o. female with dementia, hypertension, hyperlipidemia presented to hospital with generalized weakness, fatigue and tiredness with episodes of confusion and not acting herself.  Patient lives at home and has been taken care of by caregivers.  Patient was initially prescribed Macrobid by her primary care physician and had taken 1 dose this but today she developed some slurred speech and some weakness in the left arm so she was brought to the hospital.  By the time she came to the hospital her weakness and slurred speech had improved.  Patient denies any nausea, vomiting, abdominal pain, fever or chills.  Denies any chest pain, shortness of breath, dyspnea.  Denies any syncope or lightheadedness.  Denies any sick contacts or recent travel.  Denies any runny nose, sore throat.  Denies any urinary urgency, frequency or dysuria.  Initial vitals in the ED was notable for elevated blood pressure with some bradycardia.  Patient did have some left foot cellulitis.  Does have a history of bradycardia at baseline.  Labs in the ED showed hemoglobin of 9.8 no leukocytosis.  Creatinine 0.8.  UA showed some white cells from 11/10/2022 done outside.  Urine culture was sent from the ED.  Patient was started on Rocephin IV and was considered for admission to the hospital for generalized weakness, confusion, possible cellulitis.   Assessment and plan.  Generalized weakness, fatigue.  Will get PT OT evaluation.  Could be secondary to UTI.  Will check TSH, vitamin B12, vitamin D.  Possible UTI.  Was prescribed Macrobid and took 1 dose.  Continue with Rocephin.  Follow urine cultures.  Patient is afebrile.  No leukocytosis.  Left foot cellulitis.  Will continue on Rocephin.  Supportive care.  Metabolic encephalopathy mild confusion.  Likely secondary to UTI/cellulitis..  Is at baseline at this time.  Does have underlying dementia.  Will treat for infection.  Delirium precautions.  Concern for TIA  with some focal weakness.  Patient is already on aspirin at home.  Family did not wish to pursue with further workup or MRI since she has improved and imaging would unlikely change significant management.  Continue aspirin.  History of dementia.  Being cared by caregivers.  Will continue supportive care.  Hold venlafaxine for today.  History of thyroid disease.  Will check TSH.  On Synthroid we will continue.

## 2022-11-10 NOTE — H&P (Addendum)
[facility] History and Physical  Ann Green OZH:[id number] DOB: 1922/04/10 DOA: 11/10/2022  Referring physician: ED  PCP: Tawnya Crook, MD   Patient is coming from: Home  Chief Complaint: Generalized weakness  HPI:  Ann Green is a 88 y.o. female with dementia, hypertension, hyperlipidemia presented to hospital with generalized weakness, fatigue and tiredness with episodes of confusion and not acting herself.  Patient lives at home and has been taken care of by caregivers.  Patient was initially prescribed Macrobid by her primary care physician and had taken 1 dose this but today she developed some slurred speech and some weakness in the left arm so she was brought to the hospital.  By the time she came to the hospital her weakness and slurred speech had improved.  Patient denies any nausea, vomiting, abdominal pain, fever or chills.   Denies any syncope or lightheadedness.  Denies any sick contacts or recent travel.  Denies any runny nose, sore throat.  Denies any urinary urgency, frequency or dysuria.  Patient complains of mild headache at the time of my exam and feels that she could not breathe but does not have any chest pain..  Initial vitals in the ED, was notable for elevated blood pressure with some bradycardia.  Patient did have some left foot cellulitis.  Does have a history of bradycardia at baseline.  Labs in the ED showed hemoglobin of 9.8 no leukocytosis.  Creatinine 0.8.  UA showed some white cells from 11/10/2022 done outside.  Urine culture was sent from the ED.  Patient was started on Rocephin IV and was considered for admission to the hospital for generalized weakness, confusion, possible cellulitis.   Assessment and plan. Principal Problem:   UTI (urinary tract infection) Active Problems:   Thyroid disease   Essential hypertension   Hyperlipidemia   Acute metabolic encephalopathy   Cellulitis   Generalized weakness   Generalized weakness, fatigue.  Will  get PT OT evaluation.  Could be secondary to UTI.  Will check TSH, vitamin B12, vitamin D.  Mild volume depletion.  Will continue with normal saline overnight.  Check volume status in AM.  Encourage oral hydration.  Possible UTI.  Was prescribed Macrobid and took 1 dose.  Continue with Rocephin.  Follow urine cultures.  Patient is afebrile.  No leukocytosis.  Left leg mild cellulitis.  Will continue on Rocephin.  Supportive care.  Metabolic encephalopathy mild confusion.  Likely secondary to UTI/cellulitis..  Is at baseline at this time.  Does have underlying dementia.  Will treat for infection.  Delirium precautions.  CT head scan was negative for acute findings except for chronic small vessel ischemic changes.  Concern for TIA with some focal weakness.  Patient is already on aspirin at home.  Family did not wish to pursue with further workup or MRI since she has improved and imaging would unlikely change significant management.  Continue aspirin.  CT head scan was negative for acute findings.  History of dementia.  Being cared by caregivers.  Will continue supportive care.  Venlafaxine since the patient appears to be very anxious.  Hypothyroidism..  Will check TSH.  On Synthroid, we will continue.  CODE STATUS.  Spoke with the patient's son and granddaughter at bedside.  Patient is a DO NOT RESUSCITATE.  DVT Prophylaxis:lovenox subq   Review of Systems:  All systems were reviewed and were negative unless otherwise mentioned in the HPI   Past Medical History:  Diagnosis Date   Thyroid disease  History reviewed. No pertinent surgical history.  Social History:  reports that she has never smoked. She has never used smokeless tobacco. She reports that she does not currently use alcohol. She reports that she does not use drugs.  Allergies  Allergen Reactions   Codeine Other (See Comments)    Other reaction(s): Hallucination     History reviewed. No pertinent family history.    Prior to Admission medications   Medication Sig Start Date End Date Taking? Authorizing Provider  acetaminophen (TYLENOL) 500 MG tablet Take 500-1,000 mg by mouth daily.    [provider]  aspirin 81 MG chewable tablet Chew 81 mg by mouth daily.    [provider]  cholecalciferol (VITAMIN D3) 25 MCG (1000 UNIT) tablet Take 1,000 Units by mouth daily.    [provider]  ferrous sulfate 325 (65 FE) MG tablet Take 325 mg by mouth every morning.    [provider]  levothyroxine (SYNTHROID, LEVOTHROID) 75 MCG tablet Take 75 mcg by mouth daily. 06/05/18   [provider]  omeprazole (PRILOSEC) 20 MG capsule Take 20 mg by mouth daily. 10/21/17   [provider]  venlafaxine XR (EFFEXOR-XR) 150 MG 24 hr capsule Take 150 mg by mouth daily. (take with 75mg  dose to equal 225mg  daily)    [provider]  venlafaxine XR (EFFEXOR-XR) 75 MG 24 hr capsule Take 75 mg by mouth daily. (take with 150mg  dose to equal 225mg  daily)    [provider]    Physical Exam: Vitals:   11/10/22 1330 11/10/22 1641 11/10/22 1644  BP: (!) 198/67 (!) 189/71   Pulse: (!) 56 (!) 59 (!) 59  Resp: 18 16   Temp: 97.6 F (36.4 C) 98 F (36.7 C)   TempSrc:  Oral   SpO2: 98% 98% 100%   Wt Readings from Last 3 Encounters:  05/24/20 63.5 kg  07/15/18 61.2 kg  06/15/18 59 kg   There is no height or weight on file to calculate BMI.  General: Thinly built, elderly female, anxious and tearful, HENT: Normocephalic, No scleral pallor or icterus noted. Oral mucosa is moist.  Chest:  Clear breath sounds.  No crackles or wheezes.  CVS: S1 &S2 heard. No murmur.  Regular rate and rhythm. Abdomen: Soft, nontender, nondistended.  Bowel sounds are heard. No abdominal mass palpated Extremities: No cyanosis, clubbing or edema.  Peripheral pulses are palpable.  Mild erythema of the left leg.  Cool right lower extremity but dorsalis pedis palpable. Psych: Alert,  awake and oriented, normal mood CNS:  No cranial nerve deficits.  Moves extremities, follows commands, Skin: Warm and dry.  Mild erythema left leg. Labs on Admission:   CBC: Recent Labs  Lab 11/10/22 1334  WBC 6.0  NEUTROABS 4.0  HGB 9.8*  HCT 33.1*  MCV 89.7  PLT [id number]    Basic Metabolic Panel: Recent Labs  Lab 11/10/22 1334  NA 139  K 3.9  CL 109  CO2 23  GLUCOSE 90  BUN 21  CREATININE 0.84  CALCIUM 8.3*    Liver Function Tests: Recent Labs  Lab 11/10/22 1334  AST 17  ALT 10  ALKPHOS 67  BILITOT 0.7  PROT 6.3*  ALBUMIN 3.2*   No results for input(s): "LIPASE", "AMYLASE" in the last 168 hours. No results for input(s): "AMMONIA" in the last 168 hours.  Cardiac Enzymes: No results for input(s): "CKTOTAL", "CKMB", "CKMBINDEX", "TROPONINI" in the last 168 hours.  BNP (last 3 results) No results for  input(s): "BNP" in the last 8760 hours.  ProBNP (last 3 results) No results for input(s): "PROBNP" in the last 8760 hours.  CBG: No results for input(s): "GLUCAP" in the last 168 hours.  Lipase  No results found for: "LIPASE"   Urinalysis No results found for: "COLORURINE", "APPEARANCEUR", "LABSPEC", "PHURINE", "GLUCOSEU", "HGBUR", "BILIRUBINUR", "KETONESUR", "PROTEINUR", "UROBILINOGEN", "NITRITE", "LEUKOCYTESUR"   Drugs of Abuse  No results found for: "LABOPIA", "COCAINSCRNUR", "LABBENZ", "AMPHETMU", "THCU", "LABBARB"    Radiological Exams on Admission: CT HEAD WO CONTRAST (5MM)  Result Date: 11/10/2022 CLINICAL DATA:  Mental status change EXAM: CT HEAD WITHOUT CONTRAST TECHNIQUE: Contiguous axial images were obtained from the base of the skull through the vertex without intravenous contrast. RADIATION DOSE REDUCTION: This exam was performed according to the departmental dose-optimization program which includes automated exposure control, adjustment of the mA and/or kV according to patient size and/or use of iterative reconstruction technique. COMPARISON:   CT brain 05/24/2020 FINDINGS: Brain: No acute territorial infarction, hemorrhage, or intracranial mass. Moderate atrophy. Moderate severe chronic small vessel ischemic changes of the white matter. Chronic lacunar infarcts in the basal ganglia. Stable ventricle size. Vascular: No hyperdense vessels.  Carotid vascular calcification Skull: Normal. Negative for fracture or focal lesion. Sinuses/Orbits: No acute finding. Other: None IMPRESSION: 1. No CT evidence for acute intracranial abnormality. 2. Atrophy and chronic small vessel ischemic changes of the white matter. Electronically Signed   By: Donavan Foil M.D.   On: 11/10/2022 16:00    EKG: Not available for review.   Consultant: None  Code Status: DNR  Microbiology urine culture  Antibiotics: Rocephin IV  Family Communication:  Patients' condition and plan of care including tests being ordered have been discussed with the patient and the patient's son and granddaughter who indicate understanding and agree with the plan.   Status is: Observation  The patient remains OBS appropriate and will d/c before 2 midnights.   Severity of Illness: The appropriate patient status for this patient is OBSERVATION. Observation status is judged to be reasonable and necessary in order to provide the required intensity of service to ensure the patient's safety. The patient's presenting symptoms, physical exam findings, and initial radiographic and laboratory data in the context of their medical condition is felt to place them at decreased risk for further clinical deterioration. Furthermore, it is anticipated that the patient will be medically stable for discharge from the hospital within 2 midnights of admission.   Signed, Flora Lipps, MD [facility] 11/10/2022

## 2022-11-11 DIAGNOSIS — G9341 Metabolic encephalopathy: Secondary | ICD-10-CM | POA: Diagnosis not present

## 2022-11-11 DIAGNOSIS — N390 Urinary tract infection, site not specified: Secondary | ICD-10-CM | POA: Diagnosis not present

## 2022-11-11 DIAGNOSIS — R531 Weakness: Secondary | ICD-10-CM | POA: Diagnosis not present

## 2022-11-11 DIAGNOSIS — I10 Essential (primary) hypertension: Secondary | ICD-10-CM | POA: Diagnosis not present

## 2022-11-11 LAB — MAGNESIUM: Magnesium: 1.9 mg/dL (ref 1.7–2.4)

## 2022-11-11 LAB — VITAMIN B12: Vitamin B-12: 202 pg/mL (ref 180–914)

## 2022-11-11 LAB — BASIC METABOLIC PANEL
Anion gap: 3 — ABNORMAL LOW (ref 5–15)
BUN: 19 mg/dL (ref 8–23)
CO2: 28 mmol/L (ref 22–32)
Calcium: 8.7 mg/dL — ABNORMAL LOW (ref 8.9–10.3)
Chloride: 109 mmol/L (ref 98–111)
Creatinine, Ser: 0.87 mg/dL (ref 0.44–1.00)
GFR, Estimated: >60 mL/min (ref >60)
Glucose, Bld: 98 mg/dL (ref 70–99)
Potassium: 4 mmol/L (ref 3.5–5.1)
Sodium: 140 mmol/L (ref 135–145)

## 2022-11-11 LAB — CBC
HCT: 36.2 % (ref 36.0–46.0)
Hemoglobin: 11 g/dL — ABNORMAL LOW (ref 12.0–15.0)
MCH: 26.2 pg (ref 26.0–34.0)
MCHC: 30.4 g/dL (ref 30.0–36.0)
MCV: 86.2 fL (ref 80.0–100.0)
Platelets: 221 10*3/uL (ref 150–400)
RBC: 4.2 MIL/uL (ref 3.87–5.11)
RDW: 20.6 % — ABNORMAL HIGH (ref 11.5–15.5)
WBC: 5.8 10*3/uL (ref 4.0–10.5)
nRBC: 0 % (ref 0.0–0.2)

## 2022-11-11 LAB — VITAMIN D 25 HYDROXY (VIT D DEFICIENCY, FRACTURES): Vit D, 25-Hydroxy: 53 ng/mL (ref 30–100)

## 2022-11-11 MED ORDER — INFLUENZA VAC A&B SA ADJ QUAD 0.5 ML IM PRSY: 0.5000 mL | PREFILLED_SYRINGE | INTRAMUSCULAR | Status: DC

## 2022-11-11 NOTE — Evaluation (Addendum)
Physical Therapy Evaluation Patient Details Name: Ann Green MRN: [id number] DOB: [birth year] Today's Date: 11/11/2022  History of Present Illness  The pt is a 88yo female with dementia, HTN, HLD, presenting to the hospital with generalized weakness, fatigue, and tiredness with episodes of confusion. The pt has a PMHx of dementia and bradycardia at baseline. Currently the pt is being treated for UTI and L foot cellulitis. She currently lives at home with caregiver support.  Clinical Impression  The pt presents this session in good spirits. She indicates that she has a fear of falling but is willing to try. The pt demonstrates significant posterior lean in static sitting and during sit<>stand transitions; requiring Mod-Max A for correction. She also requires maximum verbal cueing for foot placement and technique for sit<>stand transfer. At this time the pt requires Mod-Max A for all transfers, which is more than her baseline level of functioning. Once she feels comfortable she is able to progress to ambulate with Min A for steadying d/t balance concerns. At this time the patient would best benefit from SNF in order to optimize her mobility to safely transition back home with caregiver assistance. She continues to be at increased risk of falls given her current level of mobility and requires SNF in order to reduce this risk.      Recommendations for follow up therapy are one component of a multi-disciplinary discharge planning process, led by the attending physician.  Recommendations may be updated based on patient status, additional functional criteria and insurance authorization.  Follow Up Recommendations Skilled nursing-short term rehab (<3 hours/day) Can patient physically be transported by private vehicle: No    Assistance Recommended at Discharge Frequent or constant Supervision/Assistance  Patient can return home with the following  A lot of help with walking and/or transfers;A lot of help  with bathing/dressing/bathroom;Direct supervision/assist for financial management;Help with stairs or ramp for entrance;Direct supervision/assist for medications management;Assistance with cooking/housework;Assist for transportation    Equipment Recommendations    Recommendations for Other Services       Functional Status Assessment Patient has had a recent decline in their functional status and demonstrates the ability to make significant improvements in function in a reasonable and predictable amount of time.     Precautions / Restrictions Precautions Precautions: Fall      Mobility  Bed Mobility Overal bed mobility: Needs Assistance Bed Mobility: Supine to Sit, Sit to Supine     Supine to sit: Min assist, HOB elevated (increased time) Sit to supine: Mod assist        Transfers Overall transfer level: Needs assistance Equipment used: 1 person hand held assist Transfers: Sit to/from Stand, Bed to chair/wheelchair/BSC Sit to Stand: Mod assist, From elevated surface     Squat pivot transfers: Mod assist, From elevated surface     General transfer comment: Pt requiring blocking on bialteral feet, verbal cues for forward translation and foot positioning.    Ambulation/Gait Ambulation/Gait assistance: Min assist, Mod A Gait Distance (Feet): 30 Feet Assistive device: Rolling walker (2 wheels) Gait Pattern/deviations: Step-to pattern, Decreased stride length, Knee flexed in stance - left, Knee flexed in stance - right, Narrow base of support          Stairs            Wheelchair Mobility    Modified Rankin (Stroke Patients Only)       Balance Overall balance assessment: History of Falls, Needs assistance Sitting-balance support: Feet supported Sitting balance-Leahy Scale: Fair   Postural  control: Posterior lean Standing balance support: During functional activity Standing balance-Leahy Scale: Fair                                Pertinent Vitals/Pain Pain Assessment Pain Assessment: No/denies pain    Home Living Family/patient expects to be discharged to:: Private residence Living Arrangements: Children;Other (Comment) (Caregiver M-F 8a-4p. Son and granddaughter from 4p-8a. Son on weekends 27/4 with intermittent assitance from granddaughter) Available Help at Discharge: Personal care attendant;Family;Available 24 hours/day Type of Home: House Home Access: Ramped entrance       Home Layout: One level Home Equipment: Rollator (4 wheels);Shower seat;Tub bench;Toilet riser;Grab bars - tub/shower      Prior Function Prior Level of Function : Needs assist;History of Falls (last six months)Prior to admission, patient was requiring Min A physical assistance for transfers.  Cognitive Assist : ADLs (cognitive) Pt requiring verbal cues for hand placement to prepare for functional mobility.   ADLs (Cognitive): Intermittent cues Physical Assist : Mobility (physical);ADLs (physical) Min A prior to admission Mobility (physical): Transfers;Gait Min A/Standby assistance for ambulation.  ADLs (physical): Grooming;Bathing;Dressing;Toileting;IADLs Min A for toileting prior to admission.  Mobility Comments: Per pt's granddaughter the patient has recently experienced reduced mobility.       Hand Dominance   Dominant Hand: Right    Extremity/Trunk Assessment   Upper Extremity Assessment Upper Extremity Assessment: Generalized weakness    Lower Extremity Assessment Lower Extremity Assessment: Generalized weakness    Cervical / Trunk Assessment Cervical / Trunk Assessment: Kyphotic  Communication   Communication: HOH  Cognition Arousal/Alertness: Awake/alert Behavior During Therapy: WFL for tasks assessed/performed Overall Cognitive Status: History of cognitive impairments - at baseline                                          General Comments      Exercises     Assessment/Plan     PT Assessment Patient needs continued PT services  PT Problem List Decreased strength;Decreased mobility;Decreased balance;Decreased activity tolerance       PT Treatment Interventions Gait training;Functional mobility training;Therapeutic activities;Patient/family education;Balance training    PT Goals (Current goals can be found in the Care Plan section)  Acute Rehab PT Goals Patient Stated Goal: be more independent PT Goal Formulation: With patient Time For Goal Achievement: 11/25/22 Potential to Achieve Goals: Good    Frequency Min 2X/week     Co-evaluation               AM-PAC PT "6 Clicks" Mobility  Outcome Measure Help needed turning from your back to your side while in a flat bed without using bedrails?: A Lot Help needed moving from lying on your back to sitting on the side of a flat bed without using bedrails?: A Lot Help needed moving to and from a bed to a chair (including a wheelchair)?: A Lot Help needed standing up from a chair using your arms (e.g., wheelchair or bedside chair)?: A Lot Help needed to walk in hospital room?: A Little Help needed climbing 3-5 steps with a railing? : Total 6 Click Score: 12    End of Session Equipment Utilized During Treatment: Gait belt Activity Tolerance: Patient tolerated treatment well Patient left: in bed;with bed alarm set;with family/visitor present;with call bell/phone within reach Nurse Communication: Mobility status;Other (comment) (d/c plan update)  PT Visit Diagnosis: Unsteadiness on feet (R26.81);History of falling (Z91.81);Repeated falls (R29.6);Muscle weakness (generalized) (M62.81)    Time: 2585-2778 PT Time Calculation (min) (ACUTE ONLY): 55 min   Charges:   PT Evaluation $PT Eval Moderate Complexity: 1 Mod PT Treatments $Gait Training: 23-37 mins $Therapeutic Activity: 8-22 mins        10:45 AM, 11/11/22 Arihanna Estabrook A. Mordecai Maes PT, DPT Physical Therapist - [facility]   Kydan Shanholtzer A Eshan Trupiano 11/11/2022, 10:40 AM

## 2022-11-11 NOTE — Progress Notes (Addendum)
PROGRESS NOTE    Ann Green  ZOX:[id number] DOB: December 05, 1921 DOA: 11/10/2022 PCP: Tawnya Crook, MD    Brief Narrative:  Ann Green is a 88 y.o. female with dementia, hypertension, hyperlipidemia presented to hospital with generalized weakness, fatigue and tiredness with episodes of confusion and not acting herself.  Patient lives at home and has been taken care of by caregivers.  Patient was initially prescribed Macrobid by her primary care physician and had taken 1 dose this but today she developed some slurred speech and some weakness in the left arm so she was brought to the hospital.  By the time she came to the hospital her weakness and slurred speech had improved.  Patient denies any nausea, vomiting, abdominal pain, fever or chills.  Denies any chest pain, shortness of breath, dyspnea.  Denies any syncope or lightheadedness.  Denies any sick contacts or recent travel.  Denies any runny nose, sore throat.  Denies any urinary urgency, frequency or dysuria.  Initial vitals in the ED was notable for elevated blood pressure with some bradycardia.  Patient did have some left foot cellulitis.  Does have a history of bradycardia at baseline.  Labs in the ED showed hemoglobin of 9.8 no leukocytosis.  Creatinine 0.8.  UA showed some white cells from 11/10/2022 done outside.  Urine culture was sent from the ED.  Patient was started on Rocephin IV and was considered for admission to the hospital for generalized weakness, confusion, possible cellulitis.   Assessment and plan.  Generalized weakness, fatigue.  Does have a caretakers at home and i physical therapy evaluation here recommend skilled nursing facility placement at this time.  TOC consulted.  TSH vitamin B12 low normal range., vitamin D levels pending.    Possible acute cystitis..  Was prescribed Macrobid and took 1 dose.  On Rocephin while in the hospital.  Could resume Macrobid on discharge.  .  Patient is afebrile.  No leukocytosis.    Metabolic encephalopathy mild confusion.  Likely secondary to UTI/cellulitis..  Is at baseline at this time.  Does have underlying dementia.     Concern for TIA with some focal weakness.  Patient is already on aspirin at home.  Family did not wish to pursue with further workup or MRI since she has improved and imaging would unlikely change significant management.  Continue aspirin on discharge.Marland Kitchen   History of dementia.  Being cared by caregivers.  Will continue supportive care.  Continue venlafaxine.  Plan for skilled nursing facility at this time.   Hypothyroidism.  TSH within normal range.  Continue Synthroid.     DVT prophylaxis: enoxaparin (LOVENOX) injection 40 mg Start: 11/10/22 2200   Code Status:     Code Status: DNR  Disposition: Skilled nursing facility  Status is: Observation  The patient will require care spanning > 2 midnights and should be moved to inpatient because: Need for skilled nursing facility   Family Communication: Spoke with the patient daughter at bedside.  As per PT family wishing skilled nursing facility.  Consultants:  None  Procedures:  None  Antimicrobials:  Rocephin IV  Anti-infectives (From admission, onward)    Start     Dose/Rate Route Frequency Ordered Stop   11/11/22 1600  cefTRIAXone (ROCEPHIN) 1 g in sodium chloride 0.9 % 100 mL IVPB        1 g 200 mL/hr over 30 Minutes Intravenous Every 24 hours 11/10/22 1819     11/10/22 1700  cefTRIAXone (ROCEPHIN) 2 g in sodium  chloride 0.9 % 100 mL IVPB        2 g 200 mL/hr over 30 Minutes Intravenous  Once 11/10/22 1646 11/10/22 1820        Subjective: Today, patient was seen and examined at bedside.  Denies any nausea vomiting fever chills or rigor.  Objective: Vitals:   11/10/22 2336 11/11/22 0447 11/11/22 0500 11/11/22 0751  BP: (!) 146/47 (!) 148/60  (!) 153/54  Pulse: 75 66  68  Resp: 18 16  16   Temp: 97.9 F (36.6 C) 98 F (36.7 C)  98.8 F (37.1 C)  TempSrc:      SpO2:  100% 93%    Weight:   62.2 kg   Height:        Intake/Output Summary (Last 24 hours) at 11/11/2022 1052 Last data filed at 11/11/2022 0500 Gross per 24 hour  Intake 434.91 ml  Output 600 ml  Net -165.09 ml   Filed Weights   11/10/22 1823 11/11/22 0500  Weight: 61.5 kg 62.2 kg    Physical Examination: Body mass index is 26.78 kg/m.  General:  Thinly built, not in obvious distress HENT:   No scleral pallor or icterus noted. Oral mucosa is moist.  Chest:  Clear breath sounds.  Diminished breath sounds bilaterally. No crackles or wheezes.  CVS: S1 &S2 heard. No murmur.  Regular rate and rhythm. Abdomen: Soft, nontender, nondistended.  Bowel sounds are heard.   Extremities: No cyanosis, clubbing or edema.  Peripheral pulses are palpable.  Right lower extremity but dorsalis pedis palpable. Psych: Alert, awake and Communicative, normal mood CNS:  No cranial nerve deficits.  Power equal in all extremities.   Skin: Warm and dry.  Data Reviewed:   CBC: Recent Labs  Lab 11/10/22 1334 11/11/22 0512  WBC 6.0 5.8  NEUTROABS 4.0  --   HGB 9.8* 11.0*  HCT 33.1* 36.2  MCV 89.7 86.2  PLT 194 221    Basic Metabolic Panel: Recent Labs  Lab 11/10/22 1334 11/11/22 0512  NA 139 140  K 3.9 4.0  CL 109 109  CO2 23 28  GLUCOSE 90 98  BUN 21 19  CREATININE 0.84 0.87  CALCIUM 8.3* 8.7*  MG  --  1.9    Liver Function Tests: Recent Labs  Lab 11/10/22 1334  AST 17  ALT 10  ALKPHOS 67  BILITOT 0.7  PROT 6.3*  ALBUMIN 3.2*     Radiology Studies: CT HEAD WO CONTRAST (11/12/22)  Result Date: 11/10/2022 CLINICAL DATA:  Mental status change EXAM: CT HEAD WITHOUT CONTRAST TECHNIQUE: Contiguous axial images were obtained from the base of the skull through the vertex without intravenous contrast. RADIATION DOSE REDUCTION: This exam was performed according to the departmental dose-optimization program which includes automated exposure control, adjustment of the mA and/or kV according  to patient size and/or use of iterative reconstruction technique. COMPARISON:  CT brain 05/24/2020 FINDINGS: Brain: No acute territorial infarction, hemorrhage, or intracranial mass. Moderate atrophy. Moderate severe chronic small vessel ischemic changes of the white matter. Chronic lacunar infarcts in the basal ganglia. Stable ventricle size. Vascular: No hyperdense vessels.  Carotid vascular calcification Skull: Normal. Negative for fracture or focal lesion. Sinuses/Orbits: No acute finding. Other: None IMPRESSION: 1. No CT evidence for acute intracranial abnormality. 2. Atrophy and chronic small vessel ischemic changes of the white matter. Electronically Signed   By: 05/26/2020 M.D.   On: 11/10/2022 16:00      LOS: 0 days    11/12/2022,  MD [facility] Available via Epic secure chat 7am-7pm After these hours, please refer to coverage provider listed on [email or web address] 11/11/2022, 10:52 AM

## 2022-11-11 NOTE — Discharge Summary (Incomplete)
Physician Discharge Summary   Patient: Ann Green MRN: [id number] DOB: 08-20-1922  Admit date:     11/10/2022  Discharge date: 11/11/22  Discharge Physician: Joycelyn DasLaxman Tyde Lamison   PCP: Tawnya Crookoche, Juneve, MD   Recommendations at discharge:   Follow-up with your primary care provider in 1 week.  Check CBC BMP in the next visit.  Discharge Diagnoses: Principal Problem:   UTI (urinary tract infection) Active Problems:   Thyroid disease   Essential hypertension   Hyperlipidemia   Acute metabolic encephalopathy   Cellulitis   Generalized weakness  Resolved Problems:   * No resolved hospital problems. *  Hospital Course: Ann Green is a 88 y.o. female with dementia, hypertension, hyperlipidemia presented to hospital with generalized weakness, fatigue and tiredness with episodes of confusion and not acting herself.  Patient lives at home and has been taken care of by caregivers.  Patient was initially prescribed Macrobid by her primary care physician and had taken 1 dose this but today she developed some slurred speech and some weakness in the left arm so she was brought to the hospital.  By the time she came to the hospital her weakness and slurred speech had improved.  Patient denies any nausea, vomiting, abdominal pain, fever or chills.  Denies any chest pain, shortness of breath, dyspnea.  Denies any syncope or lightheadedness.  Denies any sick contacts or recent travel.  Denies any runny nose, sore throat.  Denies any urinary urgency, frequency or dysuria.  Initial vitals in the ED was notable for elevated blood pressure with some bradycardia.  Patient did have some left foot cellulitis.  Does have a history of bradycardia at baseline.  Labs in the ED showed hemoglobin of 9.8 no leukocytosis.  Creatinine 0.8.  UA showed some white cells from 11/10/2022 done outside.  Urine culture was sent from the ED.  Patient was started on Rocephin IV and was considered for admission to the hospital for  generalized weakness, confusion, possible cellulitis.   Assessment and plan.  Generalized weakness, fatigue.  Does have a caretakers at home and is at baseline..  TSH vitamin B12 low normal range., vitamin D levels pending.  Please remove the leg.  Cellulitis ruled out.  Possible acute cystitis..  Was prescribed Macrobid and took 1 dose.  Will continue Macrobid as has already been prescribed on discharge.  Follow urine cultures.  Patient is afebrile.  No leukocytosis.  Metabolic encephalopathy mild confusion.  Likely secondary to UTI/cellulitis..  Is at baseline at this time.  Does have underlying dementia.    Concern for TIA with some focal weakness.  Patient is already on aspirin at home.  Family did not wish to pursue with further workup or MRI since she has improved and imaging would unlikely change significant management.  Continue aspirin on discharge.Marland Kitchen.  History of dementia.  Being cared by caregivers.  Will continue supportive care.  Continue venlafaxine  Hypothyroidism.  TSH within normal range.  Continue Synthroid.   Consultants: None Procedures performed: None Disposition: Home Diet recommendation:  Regular diet DISCHARGE MEDICATION: Allergies as of 11/11/2022       Reactions   Codeine Other (See Comments)   Other reaction(s): Hallucination        Medication List     TAKE these medications    acetaminophen 500 MG tablet Commonly known as: TYLENOL Take 500-1,000 mg by mouth daily.   aspirin 81 MG chewable tablet Chew 81 mg by mouth daily.   cholecalciferol 25 MCG (1000  UNIT) tablet Commonly known as: VITAMIN D3 Take 1,000 Units by mouth daily.   ferrous sulfate 325 (65 FE) MG tablet Take 325 mg by mouth every morning.   levothyroxine 75 MCG tablet Commonly known as: SYNTHROID Take 75 mcg by mouth daily.   nitrofurantoin (macrocrystal-monohydrate) 100 MG capsule Commonly known as: MACROBID Take 100 mg by mouth 2 (two) times daily.   omeprazole 20  MG capsule Commonly known as: PRILOSEC Take 20 mg by mouth daily.   venlafaxine XR 150 MG 24 hr capsule Commonly known as: EFFEXOR-XR Take 150 mg by mouth daily. (take with 75mg  dose to equal 225mg  daily)   venlafaxine XR 75 MG 24 hr capsule Commonly known as: EFFEXOR-XR Take 75 mg by mouth daily. (take with 150mg  dose to equal 225mg  daily)       Subjective.  Patient appears to be more alert awake and Communicative.  Patient's daughter at bedside.  At baseline.  No fever chills nausea vomiting.  Follow-up Information     , MD Follow up in 1 week(s).   Specialty: Internal Medicine Contact information: [address] [place] Tawnya Crook [phone]                Discharge Exam: [place] Weights   11/10/22 1823 11/11/22 0500  Weight: 61.5 kg 62.2 kg      11/11/2022    7:51 AM 11/11/2022    5:00 AM 11/11/2022    4:47 AM  Vitals with BMI  Weight  137 lbs 2 oz   BMI  26.78   Systolic 153  148  Diastolic 54  60  Pulse 68  66    General:  Thinly built, not in obvious distress HENT:   No scleral pallor or icterus noted. Oral mucosa is moist.  Chest:  Clear breath sounds.  Diminished breath sounds bilaterally. No crackles or wheezes.  CVS: S1 &S2 heard. No murmur.  Regular rate and rhythm. Abdomen: Soft, nontender, nondistended.  Bowel sounds are heard.   Extremities: No cyanosis, clubbing or edema.  Peripheral pulses are palpable.  Right lower extremity but dorsalis pedis palpable. Psych: Alert, awake and oriented, normal mood CNS:  No cranial nerve deficits.  Power equal in all extremities.   Skin: Warm and dry.   Condition at discharge: good  The results of significant diagnostics from this hospitalization (including imaging, microbiology, ancillary and laboratory) are listed below for reference.   Imaging Studies: CT HEAD WO CONTRAST (11/13/22)  Result Date: 11/10/2022 CLINICAL DATA:  Mental status change EXAM: CT HEAD WITHOUT  CONTRAST TECHNIQUE: Contiguous axial images were obtained from the base of the skull through the vertex without intravenous contrast. RADIATION DOSE REDUCTION: This exam was performed according to the departmental dose-optimization program which includes automated exposure control, adjustment of the mA and/or kV according to patient size and/or use of iterative reconstruction technique. COMPARISON:  CT brain 05/24/2020 FINDINGS: Brain: No acute territorial infarction, hemorrhage, or intracranial mass. Moderate atrophy. Moderate severe chronic small vessel ischemic changes of the white matter. Chronic lacunar infarcts in the basal ganglia. Stable ventricle size. Vascular: No hyperdense vessels.  Carotid vascular calcification Skull: Normal. Negative for fracture or focal lesion. Sinuses/Orbits: No acute finding. Other: None IMPRESSION: 1. No CT evidence for acute intracranial abnormality. 2. Atrophy and chronic small vessel ischemic changes of the white matter. Electronically Signed   By: 11/13/2022 M.D.   On: 11/10/2022 16:00    Microbiology: Results for orders placed or performed  during the hospital encounter of 11/10/22  Resp panel by RT-PCR (RSV, Flu A&B, Covid) Anterior Nasal Swab     Status: None   Collection Time: 11/10/22  4:44 PM   Specimen: Anterior Nasal Swab  Result Value Ref Range Status   SARS Coronavirus 2 by RT PCR NEGATIVE NEGATIVE Final    Comment: (NOTE) SARS-CoV-2 target nucleic acids are NOT DETECTED.  The SARS-CoV-2 RNA is generally detectable in upper respiratory specimens during the acute phase of infection. The lowest concentration of SARS-CoV-2 viral copies this assay can detect is 138 copies/mL. A negative result does not preclude SARS-Cov-2 infection and should not be used as the sole basis for treatment or other patient management decisions. A negative result may occur with  improper specimen collection/handling, submission of specimen other than nasopharyngeal swab,  presence of viral mutation(s) within the areas targeted by this assay, and inadequate number of viral copies(<138 copies/mL). A negative result must be combined with clinical observations, patient history, and epidemiological information. The expected result is Negative.  Fact Sheet for Patients:  BloggerCourse.com  Fact Sheet for Healthcare Providers:  SeriousBroker.it  This test is no t yet approved or cleared by the [place] FDA and  has been authorized for detection and/or diagnosis of SARS-CoV-2 by FDA under an Emergency Use Authorization (EUA). This EUA will remain  in effect (meaning this test can be used) for the duration of the COVID-19 declaration under Section 564(b)(1) of the Act, 21 U.S.C.section 360bbb-3(b)(1), unless the authorization is terminated  or revoked sooner.       Influenza A by PCR NEGATIVE NEGATIVE Final   Influenza B by PCR NEGATIVE NEGATIVE Final    Comment: (NOTE) The Xpert Xpress SARS-CoV-2/FLU/RSV plus assay is intended as an aid in the diagnosis of influenza from Nasopharyngeal swab specimens and should not be used as a sole basis for treatment. Nasal washings and aspirates are unacceptable for Xpert Xpress SARS-CoV-2/FLU/RSV testing.  Fact Sheet for Patients: BloggerCourse.com  Fact Sheet for Healthcare Providers: SeriousBroker.it  This test is not yet approved or cleared by the [place] FDA and has been authorized for detection and/or diagnosis of SARS-CoV-2 by FDA under an Emergency Use Authorization (EUA). This EUA will remain in effect (meaning this test can be used) for the duration of the COVID-19 declaration under Section 564(b)(1) of the Act, 21 U.S.C. section 360bbb-3(b)(1), unless the authorization is terminated or revoked.     Resp Syncytial Virus by PCR NEGATIVE NEGATIVE Final    Comment: (NOTE) Fact Sheet for  Patients: BloggerCourse.com  Fact Sheet for Healthcare Providers: SeriousBroker.it  This test is not yet approved or cleared by the [place] FDA and has been authorized for detection and/or diagnosis of SARS-CoV-2 by FDA under an Emergency Use Authorization (EUA). This EUA will remain in effect (meaning this test can be used) for the duration of the COVID-19 declaration under Section 564(b)(1) of the Act, 21 U.S.C. section 360bbb-3(b)(1), unless the authorization is terminated or revoked.  Performed at [facility], [address] [place], [place] [address]     Labs: CBC: Recent Labs  Lab 11/10/22 1334 11/11/22 0512  WBC 6.0 5.8  NEUTROABS 4.0  --   HGB 9.8* 11.0*  HCT 33.1* 36.2  MCV 89.7 86.2  PLT 194 221   Basic Metabolic Panel: Recent Labs  Lab 11/10/22 1334 11/11/22 0512  NA 139 140  K 3.9 4.0  CL 109 109  CO2 23 28  GLUCOSE 90 98  BUN  21 19  CREATININE 0.84 0.87  CALCIUM 8.3* 8.7*  MG  --  1.9   Liver Function Tests: Recent Labs  Lab 11/10/22 1334  AST 17  ALT 10  ALKPHOS 67  BILITOT 0.7  PROT 6.3*  ALBUMIN 3.2*   CBG: No results for input(s): "GLUCAP" in the last 168 hours.  Discharge time spent: greater than 30 minutes.  Signed: Joycelyn Das, MD [facility] 11/11/2022

## 2022-11-11 NOTE — NC FL2 (Signed)
[place] MEDICAID FL2 LEVEL OF CARE FORM     IDENTIFICATION  Patient Name: Ann Green Birthdate: 1922/10/13 Sex: female Admission Date (Current Location): 11/10/2022  [facility] and [place] Number:  Chiropodist and Address:  [facility], [address] [place], [place] [address]      Provider Number: [id number]  Attending Physician Name and Address:  Joycelyn Das, MD  Relative Name and Phone Number:       Current Level of Care: Hospital Recommended Level of Care: Skilled Nursing Facility Prior Approval Number:    Date Approved/Denied:   PASRR Number: [id number]  Discharge Plan: SNF    Current Diagnoses: Patient Active Problem List   Diagnosis Date Noted   UTI (urinary tract infection) 11/10/2022   Thyroid disease 11/10/2022   Essential hypertension 11/10/2022   Hyperlipidemia 11/10/2022   Acute metabolic encephalopathy 11/10/2022   Generalized weakness 11/10/2022    Orientation RESPIRATION BLADDER Height & Weight     Self  Normal Incontinent, External catheter Weight: 137 lb 2 oz (62.2 kg) Height:  5' (152.4 cm) (per family, patient unable to stand)  BEHAVIORAL SYMPTOMS/MOOD NEUROLOGICAL BOWEL NUTRITION STATUS   (None)  (Dementia) Continent Diet (Regular)  AMBULATORY STATUS COMMUNICATION OF NEEDS Skin   Limited Assist Verbally Normal                       Personal Care Assistance Level of Assistance              Functional Limitations Info  Sight, Hearing, Speech Sight Info: Adequate Hearing Info: Adequate Speech Info: Adequate    SPECIAL CARE FACTORS FREQUENCY  PT (By licensed PT)     PT Frequency: 5 x week              Contractures Contractures Info: Not present    Additional Factors Info  Code Status, Allergies Code Status Info: DNR Allergies Info: Codeine           Current Medications (11/11/2022):  This is the current hospital active medication list Current Facility-Administered  Medications  Medication Dose Route Frequency Provider Last Rate Last Admin   0.9 %  sodium chloride infusion   Intravenous Continuous Pokhrel, Laxman, MD   Stopped at 11/11/22 0947   acetaminophen (TYLENOL) tablet 650 mg  650 mg Oral Q6H PRN Pokhrel, Laxman, MD       aspirin chewable tablet 81 mg  81 mg Oral Daily Pokhrel, Laxman, MD   81 mg at 11/11/22 0932   cefTRIAXone (ROCEPHIN) 1 g in sodium chloride 0.9 % 100 mL IVPB  1 g Intravenous Q24H Pokhrel, Laxman, MD       cholecalciferol (VITAMIN D3) 25 MCG (1000 UNIT) tablet 1,000 Units  1,000 Units Oral Daily Pokhrel, Laxman, MD   1,000 Units at 11/11/22 0932   docusate sodium (COLACE) capsule 100 mg  100 mg Oral BID Pokhrel, Laxman, MD   100 mg at 11/11/22 0933   enoxaparin (LOVENOX) injection 40 mg  40 mg Subcutaneous Q24H Pokhrel, Laxman, MD   40 mg at 11/10/22 2059   ferrous sulfate tablet 325 mg  325 mg Oral Daily Pokhrel, Laxman, MD   325 mg at 11/11/22 0933   hydrALAZINE (APRESOLINE) injection 5 mg  5 mg Intravenous Q6H PRN Pokhrel, Laxman, MD   5 mg at 11/10/22 1852   [START ON 11/12/2022] influenza vaccine adjuvanted (FLUAD) injection 0.5 mL  0.5 mL Intramuscular Tomorrow-1000 Pokhrel, Rebekah [place], MD  levothyroxine (SYNTHROID) tablet 75 mcg  75 mcg Oral Q0600 Pokhrel, Laxman, MD   75 mcg at 11/10/22 1831   ondansetron (ZOFRAN) tablet 4 mg  4 mg Oral Q6H PRN Pokhrel, Laxman, MD       Or   ondansetron (ZOFRAN) injection 4 mg  4 mg Intravenous Q6H PRN Pokhrel, Laxman, MD       pantoprazole (PROTONIX) EC tablet 40 mg  40 mg Oral Daily Pokhrel, Laxman, MD   40 mg at 11/11/22 0933   venlafaxine XR (EFFEXOR-XR) 24 hr capsule 150 mg  150 mg Oral Daily Pokhrel, Laxman, MD   150 mg at 11/11/22 0932   venlafaxine XR (EFFEXOR-XR) 24 hr capsule 75 mg  75 mg Oral Daily Pokhrel, Laxman, MD   75 mg at 11/11/22 1655     Discharge Medications: Please see discharge summary for a list of discharge medications.  Relevant Imaging Results:  Relevant  Lab Results:   Additional Information SS#: [id number]  Margarito Liner, LCSW

## 2022-11-11 NOTE — TOC Progression Note (Addendum)
Transition of Care [facility]) - Progression Note    Patient Details  Name: Ann Green MRN: [id number] Date of Birth: 1922-06-05  Transition of Care [facility]) CM/SW Contact  Ann Quarry, RN Phone Number: 11/11/2022, 12:10 PM  Clinical Narrative: 12/17: Ann Green with granddaughter, Ann Green, as patient had AMS/confusion on admission due to Dx of UTI. History of dementia, lives alone with caregivers  by [employer] since April 2023 arranged by supportive family. Also has family living on the same street. Patient has never been to a STR or nursing home. Discussed CM role and STR/SNF choices/bed search process. [facility] [place] [place] is first preference before reviewing list on Medicare.gov website provided. Will review list for alternatives and gave verbal permission to also start broad bed search while choice reviewed. Confirmed PCP as listed in EPIC chart.   First preference for STR/SNF is [facility] due to proximity to where family lives in order to present at facility to help/assist/caregiver. Wants to review other bed offers as they come in. Family had been considering placement as patient needed more help at 88 year old.   Physical Address: [address] [place], [place] [address]  Ann Cirri RN CM   Ann Green (Granddaughter) [phone] (Home Phone)   PCP: Ann Green [[id number]]  Ann Green, [place]      Expected Discharge Plan and Services           Expected Discharge Date: 11/11/22                                     Social Determinants of Health (SDOH) Interventions    Readmission Risk Interventions     No data to display

## 2022-11-12 DIAGNOSIS — I129 Hypertensive chronic kidney disease with stage 1 through stage 4 chronic kidney disease, or unspecified chronic kidney disease: Secondary | ICD-10-CM | POA: Diagnosis present

## 2022-11-12 DIAGNOSIS — E785 Hyperlipidemia, unspecified: Secondary | ICD-10-CM | POA: Diagnosis present

## 2022-11-12 DIAGNOSIS — R7989 Other specified abnormal findings of blood chemistry: Secondary | ICD-10-CM | POA: Diagnosis not present

## 2022-11-12 DIAGNOSIS — E872 Acidosis, unspecified: Secondary | ICD-10-CM | POA: Diagnosis not present

## 2022-11-12 DIAGNOSIS — E8809 Other disorders of plasma-protein metabolism, not elsewhere classified: Secondary | ICD-10-CM | POA: Diagnosis not present

## 2022-11-12 DIAGNOSIS — Z1152 Encounter for screening for COVID-19: Secondary | ICD-10-CM | POA: Diagnosis not present

## 2022-11-12 DIAGNOSIS — Z23 Encounter for immunization: Secondary | ICD-10-CM | POA: Diagnosis present

## 2022-11-12 DIAGNOSIS — R4781 Slurred speech: Secondary | ICD-10-CM | POA: Diagnosis present

## 2022-11-12 DIAGNOSIS — R531 Weakness: Secondary | ICD-10-CM | POA: Diagnosis present

## 2022-11-12 DIAGNOSIS — E079 Disorder of thyroid, unspecified: Secondary | ICD-10-CM | POA: Diagnosis not present

## 2022-11-12 DIAGNOSIS — E039 Hypothyroidism, unspecified: Secondary | ICD-10-CM | POA: Diagnosis present

## 2022-11-12 DIAGNOSIS — N3000 Acute cystitis without hematuria: Secondary | ICD-10-CM | POA: Diagnosis present

## 2022-11-12 DIAGNOSIS — N1831 Chronic kidney disease, stage 3a: Secondary | ICD-10-CM | POA: Diagnosis present

## 2022-11-12 DIAGNOSIS — E860 Dehydration: Secondary | ICD-10-CM | POA: Diagnosis not present

## 2022-11-12 DIAGNOSIS — R051 Acute cough: Secondary | ICD-10-CM | POA: Diagnosis not present

## 2022-11-12 DIAGNOSIS — G9341 Metabolic encephalopathy: Secondary | ICD-10-CM | POA: Diagnosis present

## 2022-11-12 DIAGNOSIS — I10 Essential (primary) hypertension: Secondary | ICD-10-CM | POA: Diagnosis not present

## 2022-11-12 DIAGNOSIS — Z751 Person awaiting admission to adequate facility elsewhere: Secondary | ICD-10-CM | POA: Diagnosis not present

## 2022-11-12 DIAGNOSIS — Z66 Do not resuscitate: Secondary | ICD-10-CM | POA: Diagnosis present

## 2022-11-12 DIAGNOSIS — R001 Bradycardia, unspecified: Secondary | ICD-10-CM | POA: Diagnosis present

## 2022-11-12 DIAGNOSIS — D649 Anemia, unspecified: Secondary | ICD-10-CM | POA: Diagnosis not present

## 2022-11-12 DIAGNOSIS — L03116 Cellulitis of left lower limb: Secondary | ICD-10-CM | POA: Diagnosis present

## 2022-11-12 DIAGNOSIS — N390 Urinary tract infection, site not specified: Secondary | ICD-10-CM | POA: Diagnosis present

## 2022-11-12 DIAGNOSIS — F03918 Unspecified dementia, unspecified severity, with other behavioral disturbance: Secondary | ICD-10-CM | POA: Diagnosis present

## 2022-11-12 DIAGNOSIS — R0789 Other chest pain: Secondary | ICD-10-CM | POA: Diagnosis present

## 2022-11-12 DIAGNOSIS — N179 Acute kidney failure, unspecified: Secondary | ICD-10-CM | POA: Diagnosis not present

## 2022-11-12 DIAGNOSIS — Z79899 Other long term (current) drug therapy: Secondary | ICD-10-CM | POA: Diagnosis not present

## 2022-11-12 MED ORDER — ENOXAPARIN SODIUM 30 MG/0.3ML IJ SOSY
30.0000 mg | PREFILLED_SYRINGE | INTRAMUSCULAR | Status: DC
  Administered 2022-11-13 – 2022-11-26 (×14): 30 mg via SUBCUTANEOUS
  Filled 2022-11-12 (×15): qty 0.3

## 2022-11-12 NOTE — Progress Notes (Signed)
PROGRESS NOTE    Ann Green  AUQ:[id number] DOB: 21-Sep-1922 DOA: 11/10/2022 PCP: Tawnya Crook, MD    Brief Narrative:  Ann Green is a 88 y.o. female with dementia, hypertension, hyperlipidemia presented to hospital with generalized weakness, fatigue and tiredness with episodes of confusion and not acting herself.  Patient also had slurred speech and some weakness in the left arm so she was brought to the hospital.  By the time she came to the hospital her weakness and slurred speech had improved. In the ED, patient had elevated blood pressure with some bradycardia.  Does have a history of bradycardia at baseline.  Labs in the ED showed hemoglobin of 9.8 no leukocytosis.  Creatinine 0.8.  UA showed some white cells from 11/10/2022 done outside.  Patient was started on Rocephin IV and was considered for admission to the hospital for generalized weakness, confusion, UTI.  Assessment and plan.  Generalized weakness, fatigue.  Does have a caretakers at home. physical therapy evaluation here recommend skilled nursing facility placement at this time.  TOC consulted.  TSH vitamin B12 low normal range., vitamin D levels at 53.    Possible acute cystitis..  Was prescribed Macrobid and took 1 dose.  On Rocephin while in the hospital.  Plan is to complete 3-day course.  Patient is afebrile.  No leukocytosis.   Metabolic encephalopathy mild confusion.  Likely secondary to UTI/cellulitis..  Is at baseline at this time.  Does have underlying dementia.     Concern for TIA with some focal weakness.  Patient is already on aspirin at home.  Family did not wish to pursue with further workup or MRI since she has improved and imaging would unlikely change significant management.  Continue aspirin on discharge.Marland Kitchen   History of dementia.  Being cared by caregivers.  Will continue supportive care.  Continue venlafaxine.  Plan for skilled nursing facility at this time.   Hypothyroidism.  TSH within normal range.   Continue Synthroid.     DVT prophylaxis: enoxaparin (LOVENOX) injection 30 mg Start: 11/12/22 2200   Code Status:     Code Status: DNR  Disposition: Skilled nursing facility  Status is: Observation  The patient will require care spanning > 2 midnights and should be moved to inpatient because: Need for skilled nursing facility   Family Communication:  Spoke with the patient daughter at bedside. Family wishing skilled nursing facility.  Consultants:  None  Procedures:  None  Antimicrobials:  Rocephin IV 11/10/22  Anti-infectives (From admission, onward)    Start     Dose/Rate Route Frequency Ordered Stop   11/11/22 1600  cefTRIAXone (ROCEPHIN) 1 g in sodium chloride 0.9 % 100 mL IVPB        1 g 200 mL/hr over 30 Minutes Intravenous Every 24 hours 11/10/22 1819     11/10/22 1700  cefTRIAXone (ROCEPHIN) 2 g in sodium chloride 0.9 % 100 mL IVPB        2 g 200 mL/hr over 30 Minutes Intravenous  Once 11/10/22 1646 11/10/22 1820       Subjective: Today, patient was seen and examined at bedside.  Denies any nausea, vomiting, fever, chills or rigor.  Patient daughter at bedside.  No urinary urgency frequency or dysuria.    Objective: Vitals:   11/11/22 1816 11/11/22 2211 11/12/22 0406 11/12/22 0821  BP: (!) 170/60 (!) 159/50 (!) 153/54 (!) 150/61  Pulse: 68 69 71 72  Resp: 18 16 16 16   Temp: 98 F (36.7 C)  98.8 F (37.1 C) 98.5 F (36.9 C)  TempSrc:   Oral   SpO2: 96% 98% 98% 96%  Weight:      Height:        Intake/Output Summary (Last 24 hours) at 11/12/2022 1333 Last data filed at 11/11/2022 2214 Gross per 24 hour  Intake 420.53 ml  Output 300 ml  Net 120.53 ml    Filed Weights   11/10/22 1823 11/11/22 0500  Weight: 61.5 kg 62.2 kg    Physical Examination: Body mass index is 26.78 kg/m.   General: Thinly built, not in obvious distress elderly female, Communicative, HENT:   No scleral pallor or icterus noted. Oral mucosa is moist.  Chest:    Diminished breath sounds bilaterally. No crackles or wheezes.  CVS: S1 &S2 heard. No murmur.  Regular rate and rhythm. Abdomen: Soft, nontender, nondistended.  Bowel sounds are heard.   Extremities: No cyanosis, clubbing or edema.  Peripheral pulses are palpable.  Psych: Alert, awake and Communicative, normal mood CNS:  No cranial nerve deficits.  Power equal in all extremities.   Skin: Warm and dry.  Data Reviewed:   CBC: Recent Labs  Lab 11/10/22 1334 11/11/22 0512  WBC 6.0 5.8  NEUTROABS 4.0  --   HGB 9.8* 11.0*  HCT 33.1* 36.2  MCV 89.7 86.2  PLT 194 221     Basic Metabolic Panel: Recent Labs  Lab 11/10/22 1334 11/11/22 0512  NA 139 140  K 3.9 4.0  CL 109 109  CO2 23 28  GLUCOSE 90 98  BUN 21 19  CREATININE 0.84 0.87  CALCIUM 8.3* 8.7*  MG  --  1.9     Liver Function Tests: Recent Labs  Lab 11/10/22 1334  AST 17  ALT 10  ALKPHOS 67  BILITOT 0.7  PROT 6.3*  ALBUMIN 3.2*      Radiology Studies: CT HEAD WO CONTRAST ( )  Result Date: 11/10/2022 CLINICAL DATA:  Mental status change EXAM: CT HEAD WITHOUT CONTRAST TECHNIQUE: Contiguous axial images were obtained from the base of the skull through the vertex without intravenous contrast. RADIATION DOSE REDUCTION: This exam was performed according to the departmental dose-optimization program which includes automated exposure control, adjustment of the mA and/or kV according to patient size and/or use of iterative reconstruction technique. COMPARISON:  CT brain 05/24/2020 FINDINGS: Brain: No acute territorial infarction, hemorrhage, or intracranial mass. Moderate atrophy. Moderate severe chronic small vessel ischemic changes of the white matter. Chronic lacunar infarcts in the basal ganglia. Stable ventricle size. Vascular: No hyperdense vessels.  Carotid vascular calcification Skull: Normal. Negative for fracture or focal lesion. Sinuses/Orbits: No acute finding. Other: None IMPRESSION: 1. No CT evidence for  acute intracranial abnormality. 2. Atrophy and chronic small vessel ischemic changes of the white matter. Electronically Signed   By: Jasmine Pang M.D.   On: 11/10/2022 16:00      LOS: 0 days    Joycelyn Das, MD [facility] Available via Epic secure chat 7am-7pm After these hours, please refer to coverage provider listed on [email or web address] 11/12/2022, 1:33 PM

## 2022-11-12 NOTE — TOC Progression Note (Signed)
Transition of Care [facility]) - Progression Note    Patient Details  Name: Ann Green MRN: [id number] Date of Birth: 12/12/1921  Transition of Care [facility]) CM/SW Contact  Tempie Hoist, [place] Phone Number: 11/12/2022, 3:21 PM  Clinical Narrative:     TOC spoke to [place] at [employer] and the patient has an open liability claim with [employer]. He needs a letter from [place] stating that the liability claim is closed.      Expected Discharge Plan and Services      SNF     Expected Discharge Date: 11/11/22                                     Social Determinants of Health (SDOH) Interventions    Readmission Risk Interventions     No data to display

## 2022-11-12 NOTE — Progress Notes (Signed)
Mobility Specialist - Progress Note    11/12/22 1000  Mobility  Activity Stood at bedside;Transferred from bed to chair;Transferred from chair to bed;Dangled on edge of bed  Level of Assistance Moderate assist, patient does 50-74%  Assistive Device Front wheel walker  Distance Ambulated (ft) 0 ft  Activity Response Tolerated well  Mobility Referral Yes  $Mobility charge 1 Mobility   Pt resting in bed on RA upon entry. Pt STS x2 with RW. Pt had really heavy posterior lean even with foot blocking. Pt could not stand up straight to take a few steps. Pt held her self up when holding onto the walker sitting EOB. Pt transferred to chair and back to bed during bedding change. Pt left in bed with needs in reach and bed alarm activated.

## 2022-11-12 NOTE — Progress Notes (Signed)
Nutrition Brief Note  Patient identified on the Malnutrition Screening Tool (MST) Report  Wt Readings from Last 15 Encounters:  11/11/22 62.2 kg  05/24/20 63.5 kg  07/15/18 61.2 kg  06/15/18 59 kg   Pt with dementia, hypertension, hyperlipidemia presented to hospital with generalized weakness, fatigue and tiredness with episodes of confusion and not acting herself   Pt admitted with generalized weakness.   Reviewed I/O's: +121 ml x 24 hours and -45 ml since admission  UOP: 300 ml x 24 hours  Pt awaiting SNF placement for discharge.   Labs reviewed.   Current diet order is regular, patient is consuming approximately n/a% of meals at this time. Labs and medications reviewed.   No nutrition interventions warranted at this time. If nutrition issues arise, please consult RD.   Levada Schilling, RD, LDN, CDCES Registered Dietitian II Certified Diabetes Care and Education Specialist Please refer to [facility] for RD and/or RD on-call/weekend/after hours pager

## 2022-11-12 NOTE — Progress Notes (Signed)
PHARMACIST - PHYSICIAN COMMUNICATION  CONCERNING:  Enoxaparin (Lovenox) for DVT Prophylaxis    RECOMMENDATION: Patient was prescribed enoxaprin 40mg  q24 hours for VTE prophylaxis.   Filed Weights   11/10/22 1823 11/11/22 0500  Weight: 61.5 kg (135 lb 9.3 oz) 62.2 kg (137 lb 2 oz)    Body mass index is 26.78 kg/m.  Estimated Creatinine Clearance: 29.8 mL/min (by C-G formula based on SCr of 0.87 mg/dL).   Patient is candidate for enoxaparin 30mg  every 24 hours based on CrCl <32ml/min   DESCRIPTION: Pharmacy has adjusted enoxaparin dose per [facility] policy.  Patient is now receiving enoxaparin 30 mg every 24 hours    31m, PharmD, BCPS Clinical Pharmacist  11/12/2022 1:22 PM

## 2022-11-12 NOTE — Plan of Care (Signed)
  Problem: Clinical Measurements: Goal: Will remain free from infection Outcome: Progressing   Problem: Clinical Measurements: Goal: Diagnostic test results will improve Outcome: Progressing   Problem: Activity: Goal: Risk for activity intolerance will decrease Outcome: Progressing   Problem: Nutrition: Goal: Adequate nutrition will be maintained Outcome: Progressing   

## 2022-11-12 NOTE — Progress Notes (Signed)
Physical Therapy Treatment Patient Details Name: Ann Green MRN: [id number] DOB: 05/22/1922 Today's Date: 11/12/2022   History of Present Illness 88 y/o female presented to ED on 11/10/22 for AMS and UTI. Admitted for possible acute cystitis and L LE cellulitis. PMH: dementia, HTN, HLD    PT Comments    Patient supine in bed on arrival with son able to wake her easily. Agreeable to PT tx session. Required modA for bed mobility with initial posterior lean in sitting requiring cues to place hands on knees with improved posture and balance. Stood from low bed surface with minA but tends to pull up on RW. Able to take pivotal steps towards recliner with modA and RW then able to progress fwd/bwd 4' with similar assist. Posterior bias in standing requiring assist to maintain balance. Son and daughter in law present during session. Continue to recommend SNF for ongoing Physical Therapy.      Recommendations for follow up therapy are one component of a multi-disciplinary discharge planning process, led by the attending physician.  Recommendations may be updated based on patient status, additional functional criteria and insurance authorization.  Follow Up Recommendations  Skilled nursing-short term rehab (<3 hours/day) Can patient physically be transported by private vehicle: No   Assistance Recommended at Discharge Frequent or constant Supervision/Assistance  Patient can return home with the following A lot of help with walking and/or transfers;A lot of help with bathing/dressing/bathroom;Direct supervision/assist for financial management;Help with stairs or ramp for entrance;Direct supervision/assist for medications management;Assistance with cooking/housework;Assist for transportation   Equipment Recommendations  None recommended by PT    Recommendations for Other Services       Precautions / Restrictions Precautions Precautions: Fall     Mobility  Bed Mobility Overal bed mobility:  Needs Assistance Bed Mobility: Supine to Sit     Supine to sit: Mod assist     General bed mobility comments: assist for trunk elevation and repositioning hips towards EOB    Transfers Overall transfer level: Needs assistance Equipment used: Rolling Martyna Thorns (2 wheels) Transfers: Sit to/from Stand, Bed to chair/wheelchair/BSC Sit to Stand: Min assist   Step pivot transfers: Mod assist       General transfer comment: assist to boost into standing from low bed surface. Able to take steps towards recliner with modA for balance due to posterior lean.    Ambulation/Gait Ambulation/Gait assistance: Mod assist Gait Distance (Feet): 4 Feet Assistive device: Rolling Gabor Lusk (2 wheels) Gait Pattern/deviations: Step-to pattern, Decreased stride length, Knee flexed in stance - left, Knee flexed in stance - right, Narrow base of support Gait velocity: decreased     General Gait Details: able to take steps forward from recliner with modA for balance and RW management. Max cueing for sequencing with RW and posture   Stairs             Wheelchair Mobility    Modified Rankin (Stroke Patients Only)       Balance Overall balance assessment: History of Falls, Needs assistance Sitting-balance support: Feet supported Sitting balance-Leahy Scale: Poor Sitting balance - Comments: initially requiring minA to maintain sitting balance. Cues for placing hands on knees with improved anterior translation. Postural control: Posterior lean Standing balance support: Bilateral upper extremity supported, Reliant on assistive device for balance Standing balance-Leahy Scale: Poor Standing balance comment: modA for balance                            Cognition  Arousal/Alertness: Awake/alert Behavior During Therapy: WFL for tasks assessed/performed Overall Cognitive Status: History of cognitive impairments - at baseline                                 General Comments:  hx of dementia. Oriented to self        Exercises      General Comments        Pertinent Vitals/Pain Pain Assessment Pain Assessment: No/denies pain    Home Living                          Prior Function            PT Goals (current goals can now be found in the care plan section) Acute Rehab PT Goals Patient Stated Goal: did not state PT Goal Formulation: With patient Time For Goal Achievement: 11/25/22 Potential to Achieve Goals: Good Progress towards PT goals: Progressing toward goals    Frequency    Min 2X/week      PT Plan Current plan remains appropriate    Co-evaluation              AM-PAC PT "6 Clicks" Mobility   Outcome Measure  Help needed turning from your back to your side while in a flat bed without using bedrails?: A Lot Help needed moving from lying on your back to sitting on the side of a flat bed without using bedrails?: A Lot Help needed moving to and from a bed to a chair (including a wheelchair)?: A Lot Help needed standing up from a chair using your arms (e.g., wheelchair or bedside chair)?: A Little Help needed to walk in hospital room?: A Lot Help needed climbing 3-5 steps with a railing? : Total 6 Click Score: 12    End of Session Equipment Utilized During Treatment: Gait belt Activity Tolerance: Patient tolerated treatment well Patient left: in chair;with call bell/phone within reach;with chair alarm set;with family/visitor present Nurse Communication: Mobility status PT Visit Diagnosis: Unsteadiness on feet (R26.81);History of falling (Z91.81);Repeated falls (R29.6);Muscle weakness (generalized) (M62.81)     Time: 5053-9767 PT Time Calculation (min) (ACUTE ONLY): 24 min  Charges:  $Therapeutic Activity: 23-37 mins                     Millie Shorb A. Dan Humphreys PT, DPT [facility] - Acute Rehabilitation Services    Oneda Duffett A Bular Hickok 11/12/2022, 2:27 PM

## 2022-11-13 DIAGNOSIS — G9341 Metabolic encephalopathy: Secondary | ICD-10-CM | POA: Diagnosis not present

## 2022-11-13 DIAGNOSIS — I10 Essential (primary) hypertension: Secondary | ICD-10-CM | POA: Diagnosis not present

## 2022-11-13 DIAGNOSIS — N390 Urinary tract infection, site not specified: Secondary | ICD-10-CM | POA: Diagnosis not present

## 2022-11-13 DIAGNOSIS — R531 Weakness: Secondary | ICD-10-CM | POA: Diagnosis not present

## 2022-11-13 LAB — URINALYSIS, ROUTINE W REFLEX MICROSCOPIC
Bacteria, UA: NONE SEEN
Bilirubin Urine: NEGATIVE
Glucose, UA: NEGATIVE mg/dL
Hgb urine dipstick: NEGATIVE
Ketones, ur: NEGATIVE mg/dL
Leukocytes,Ua: NEGATIVE
Nitrite: NEGATIVE
Protein, ur: 30 mg/dL — AB
Specific Gravity, Urine: 1.025 (ref 1.005–1.030)
pH: 5 (ref 5.0–8.0)

## 2022-11-13 MED ORDER — DICLOFENAC SODIUM 1 % EX GEL
2.0000 g | Freq: Four times a day (QID) | CUTANEOUS | Status: DC
  Administered 2022-11-15 – 2022-11-17 (×3): 2 g via TOPICAL
  Filled 2022-11-13: qty 100

## 2022-11-13 MED ORDER — CYCLOBENZAPRINE HCL 10 MG PO TABS
5.0000 mg | ORAL_TABLET | Freq: Three times a day (TID) | ORAL | Status: DC | PRN
  Administered 2022-11-13 – 2022-11-26 (×3): 5 mg via ORAL
  Filled 2022-11-13: qty 0.5
  Filled 2022-11-13 (×3): qty 1

## 2022-11-13 MED ORDER — ALUM & MAG HYDROXIDE-SIMETH 200-200-20 MG/5ML PO SUSP
30.0000 mL | ORAL | Status: DC | PRN
  Administered 2022-11-13: 30 mL via ORAL
  Filled 2022-11-13: qty 30

## 2022-11-13 NOTE — TOC Progression Note (Addendum)
Transition of Care [facility]) - Progression Note    Patient Details  Name: Ann Green MRN: [id number] Date of Birth: [birth year]  Transition of Care [facility]) CM/SW Contact  Tempie Hoist, [place] Phone Number: 11/13/2022, 10:54 AM  Clinical Narrative:      As per the family, the patients insurance information can be confirmed with [employer] at [phone]. The family says it would take 30 days to get a letter from [place] regarding the prior liability claim.  The patient has been accepted to [facility] and [facility] SNF. [facility] has denied for admission.   TOC is following up with [facility] regarding patient's referral. [facility] has denied for admission.   TOC is starting insurance authorization for [facility].  Expected Discharge Plan and Services    SNF       Expected Discharge Date: 11/11/22                                     Social Determinants of Health (SDOH) Interventions    Readmission Risk Interventions     No data to display

## 2022-11-13 NOTE — Progress Notes (Signed)
PROGRESS NOTE    Ann Green  BMW:[id number] DOB: [birth year] DOA: 11/10/2022 PCP: Tawnya Crook, MD    Brief Narrative:  Ann Green is a 88 y.o. female with dementia, hypertension, hyperlipidemia presented to the hospital with generalized weakness, fatigue and tiredness with episodes of confusion and not acting herself.  Patient also had slurred speech and some weakness in the left arm so she was brought to the hospital.  By the time she came to the hospital, her weakness and slurred speech had improved. In the ED, patient had elevated blood pressure with some bradycardia.  Does have a history of bradycardia at baseline.  Labs in the ED, showed hemoglobin of 9.8,  no leukocytosis.  Creatinine was 0.8.  UA showed some white cells from 11/10/2022, done from outside.  Patient was started on Rocephin IV and was considered for admission to the hospital for generalized weakness, confusion, UTI.  At this time, patient has been seen by physical therapy who recommended skilled nursing facility placement.  Medically stable for disposition.  Assessment and plan.  Generalized weakness, fatigue.  Patient had been taken by caretaker at home but at this time physical therapy evaluation here recommend skilled nursing facility placement at this time.  TOC consulted.  TSH vitamin B12 low normal range., vitamin D levels at 53.  Chest wall pain today.  Reproducible tenderness on palpation.  EKG showed normal sinus rhythm no ischemic changes.  Will add muscle relaxant, diclofenac gel.    Possible acute cystitis.   Patient was prescribed Macrobid as outpatient.  At this time received 3-day course of IV Rocephin.  Patient is afebrile.  No leukocytosis.   Metabolic encephalopathy mild confusion.  Likely secondary to UTI/cellulitis..  Is at baseline at this time.  Does have underlying dementia.     Concern for TIA with some focal weakness.  Patient is already on aspirin at home.  Family did not wish to pursue  with further workup or MRI since she has improved and imaging would unlikely change significant management.  Continue aspirin on discharge.Marland Kitchen   History of dementia.  Being cared by caregivers.  Will continue supportive care.  Continue venlafaxine.  Plan for skilled nursing facility at this time.   Hypothyroidism.  TSH within normal range.  Continue Synthroid.     DVT prophylaxis: enoxaparin (LOVENOX) injection 30 mg Start: 11/12/22 2200   Code Status:     Code Status: DNR  Disposition: Skilled nursing facility when bed available.  Medically stable for disposition  Status is: Inpatient  The patient is inpatient because: Need for skilled nursing facility   Family Communication:  Spoke with the patient daughter at bedside again today.  Consultants:  None  Procedures:  None  Antimicrobials:  Rocephin IV 11/10/22  Anti-infectives (From admission, onward)    Start     Dose/Rate Route Frequency Ordered Stop   11/11/22 1600  cefTRIAXone (ROCEPHIN) 1 g in sodium chloride 0.9 % 100 mL IVPB  Status:  Discontinued        1 g 200 mL/hr over 30 Minutes Intravenous Every 24 hours 11/10/22 1819 11/13/22 1201   11/10/22 1700  cefTRIAXone (ROCEPHIN) 2 g in sodium chloride 0.9 % 100 mL IVPB        2 g 200 mL/hr over 30 Minutes Intravenous  Once 11/10/22 1646 11/10/22 1820       Subjective: Today, patient was seen and examined at bedside.  Patient complains of chest wall pain and discomfort.  No shortness  of breath.    Objective: Vitals:   11/13/22 0500 11/13/22 0512 11/13/22 0728 11/13/22 0929  BP:  (!) 139/53 (!) 148/56 (!) 171/52  Pulse:  71 70 62  Resp:  18 16 18   Temp:  97.6 F (36.4 C) 97.9 F (36.6 C)   TempSrc:      SpO2:  97% 99% 100%  Weight: 63.5 kg     Height:        Intake/Output Summary (Last 24 hours) at 11/13/2022 1236 Last data filed at 11/12/2022 2000 Gross per 24 hour  Intake 0 ml  Output 350 ml  Net -350 ml    Filed Weights   11/10/22 1823  11/11/22 0500 11/13/22 0500  Weight: 61.5 kg 62.2 kg 63.5 kg    Physical Examination: Body mass index is 27.34 kg/m.   General: Thinly built, not in obvious distress elderly female, Communicative, anxious, HENT:   No scleral pallor or icterus noted. Oral mucosa is moist.  Chest:   Diminished breath sounds bilaterally. No crackles or wheezes.  Reproducible chest wall tenderness. CVS: S1 &S2 heard. No murmur.  Regular rate and rhythm. Abdomen: Soft, nontender, nondistended.  Bowel sounds are heard.   Extremities: No cyanosis, clubbing or edema.  Peripheral pulses are palpable.  Psych: Alert, awake and Communicative, elderly female, anxious CNS:  No cranial nerve deficits.  Power equal in all extremities.   Skin: Warm and dry.  Data Reviewed:   CBC: Recent Labs  Lab 11/10/22 1334 11/11/22 0512  WBC 6.0 5.8  NEUTROABS 4.0  --   HGB 9.8* 11.0*  HCT 33.1* 36.2  MCV 89.7 86.2  PLT 194 221     Basic Metabolic Panel: Recent Labs  Lab 11/10/22 1334 11/11/22 0512  NA 139 140  K 3.9 4.0  CL 109 109  CO2 23 28  GLUCOSE 90 98  BUN 21 19  CREATININE 0.84 0.87  CALCIUM 8.3* 8.7*  MG  --  1.9     Liver Function Tests: Recent Labs  Lab 11/10/22 1334  AST 17  ALT 10  ALKPHOS 67  BILITOT 0.7  PROT 6.3*  ALBUMIN 3.2*      Radiology Studies: No results found.    LOS: 1 day    11/12/22, MD [facility] Available via Epic secure chat 7am-7pm After these hours, please refer to coverage provider listed on [email or web address] 11/13/2022, 12:36 PM

## 2022-11-14 DIAGNOSIS — R531 Weakness: Secondary | ICD-10-CM | POA: Diagnosis not present

## 2022-11-14 DIAGNOSIS — E079 Disorder of thyroid, unspecified: Secondary | ICD-10-CM | POA: Diagnosis not present

## 2022-11-14 DIAGNOSIS — N390 Urinary tract infection, site not specified: Secondary | ICD-10-CM | POA: Diagnosis not present

## 2022-11-14 DIAGNOSIS — G9341 Metabolic encephalopathy: Secondary | ICD-10-CM | POA: Diagnosis not present

## 2022-11-14 LAB — BASIC METABOLIC PANEL
Anion gap: 5 (ref 5–15)
BUN: 29 mg/dL — ABNORMAL HIGH (ref 8–23)
CO2: 27 mmol/L (ref 22–32)
Calcium: 8.5 mg/dL — ABNORMAL LOW (ref 8.9–10.3)
Chloride: 110 mmol/L (ref 98–111)
Creatinine, Ser: 1.19 mg/dL — ABNORMAL HIGH (ref 0.44–1.00)
GFR, Estimated: 41 mL/min — ABNORMAL LOW (ref >60)
Glucose, Bld: 92 mg/dL (ref 70–99)
Potassium: 4.5 mmol/L (ref 3.5–5.1)
Sodium: 142 mmol/L (ref 135–145)

## 2022-11-14 LAB — CBC
HCT: 33.2 % — ABNORMAL LOW (ref 36.0–46.0)
Hemoglobin: 10.1 g/dL — ABNORMAL LOW (ref 12.0–15.0)
MCH: 26.4 pg (ref 26.0–34.0)
MCHC: 30.4 g/dL (ref 30.0–36.0)
MCV: 86.9 fL (ref 80.0–100.0)
Platelets: 202 10*3/uL (ref 150–400)
RBC: 3.82 MIL/uL — ABNORMAL LOW (ref 3.87–5.11)
RDW: 20.6 % — ABNORMAL HIGH (ref 11.5–15.5)
WBC: 7.4 10*3/uL (ref 4.0–10.5)
nRBC: 0 % (ref 0.0–0.2)

## 2022-11-14 LAB — URINE CULTURE: Culture: NO GROWTH

## 2022-11-14 MED ORDER — SODIUM CHLORIDE 0.9 % IV SOLN: INTRAVENOUS | Status: AC

## 2022-11-14 NOTE — Progress Notes (Signed)
Patient transferred from bed to chair [phone] with assistance.

## 2022-11-14 NOTE — TOC Progression Note (Signed)
Transition of Care [facility]) - Progression Note    Patient Details  Name: Ann Green MRN: [id number] Date of Birth: 1922-07-20  Transition of Care [facility]) CM/SW Contact  Tempie Hoist, [place] Phone Number: 11/14/2022, 4:56 PM  Clinical Narrative:     [facility] sent over insurance authorization for [facility].  [facility] ID is [id number] for dates 11/15/22. Plan auth ID is pending.      Expected Discharge Plan and Services         Expected Discharge Date: 11/11/22                        SNF             Social Determinants of Health (SDOH) Interventions SDOH Screenings   Food Insecurity: No Food Insecurity (11/10/2022)  Housing: Low Risk  (11/10/2022)  Transportation Needs: No Transportation Needs (11/10/2022)  Utilities: Not At Risk (11/10/2022)  Tobacco Use: Low Risk  (11/10/2022)    Readmission Risk Interventions     No data to display

## 2022-11-14 NOTE — Progress Notes (Signed)
PROGRESS NOTE    Ann Green  ZDG:[id number] DOB: March 11, 1922 DOA: 11/10/2022 PCP: Tawnya Crook, MD   No chief complaint on file.   Brief Narrative:  Ann Green is a 88 y.o. female with dementia, hypertension, hyperlipidemia presented to the hospital with generalized weakness, fatigue and tiredness with episodes of confusion and not acting herself.  Patient also had slurred speech and some weakness in the left arm so she was brought to the hospital.  By the time she came to the hospital, her weakness and slurred speech had improved. In the ED, patient had elevated blood pressure with some bradycardia.  Does have a history of bradycardia at baseline.  Labs in the ED, showed hemoglobin of 9.8,  no leukocytosis.  Creatinine was 0.8.  UA showed some white cells from 11/10/2022, done from outside.  Patient was started on Rocephin IV and was considered for admission to the hospital for generalized weakness, confusion, UTI.   At this time, patient has been seen by physical therapy who recommended skilled nursing facility placement.  Medically stable for disposition.   Assessment & Plan:   Principal Problem:   UTI (urinary tract infection) Active Problems:   Thyroid disease   Essential hypertension   Hyperlipidemia   Acute metabolic encephalopathy   Generalized weakness  #1 generalized weakness/fatigue -Likely secondary to possible UTI, dehydration. -Patient noted to have been taking care of by caretaker at home but assessment by PT recommending SNF placement at this time. -TSH, vitamin B12 low normal range.  Vitamin D levels at 53. -TOC consulted for SNF placement.  2.  Chest wall pain -Patient noted to have some chest wall pain which was reproducible on palpation, improving clinically. -EKG done with normal sinus rhythm with no ischemic changes. -Continue diclofenac gel.  3.  Possible acute cystitis -Patient noted to have been prescribed Macrobid in the outpatient setting prior  to admission. -Urine cultures done during this hospitalization with no growth. -Status post 3 days IV Rocephin. -No further needed.  4.  Acute metabolic encephalopathy/mild confusion -Felt likely secondary to infectious etiology of UTI in the setting of underlying dementia. -Status post 3 days IV Rocephin. -Patient currently afebrile. -Clinical improvement, currently at baseline.  5.  Concern for TIA with some focal weakness -Patient already noted to be on aspirin prior to admission. -Head CT done with no acute abnormalities. -Per Dr. Tyson Babinski, family did not wish to pursue any further workup including MRI as patient had improved and imaging would not likely change current management. -Continue aspirin on discharge for secondary stroke prophylaxis.  6.  Hypothyroidism -Continue Synthroid. -Outpatient follow-up.  7.  History of dementia -Being cared by caregivers. -Continue Effexor. -Supportive care. -Plan for SNF placement.  8.  Dehydration -Patient with slight bump in BUN/creatinine. -Per family patient noted with some poor oral intake. -Gentle hydration for the next 24 hours.   DVT prophylaxis: Lovenox Code Status: DNR Family Communication: Updated family, granddaughter, son, daughter-in-law at bedside. Disposition: Awaiting SNF placement.  Status is: Inpatient Remains inpatient appropriate because: Unsafe disposition   Consultants:  None  Procedures: CT head 11/10/2022   Antimicrobials:  IV Rocephin 11/10/2022>>>> 11/13/2022   Subjective: Pleasant lady, alert oriented to self place and time.  Denies any significant chest pain.  No shortness of breath.  Family at bedside.  Objective: Vitals:   11/14/22 0329 11/14/22 0422 11/14/22 0735 11/14/22 1608  BP:  121/62 (!) 148/55 (!) 133/46  Pulse:  73 70 72  Resp:  19  16 16  Temp:  97.8 F (36.6 C) (!) 97.5 F (36.4 C) 97.8 F (36.6 C)  TempSrc:   Oral   SpO2:  98% 94% 100%  Weight: 64.8 kg     Height:         Intake/Output Summary (Last 24 hours) at 11/14/2022 1711 Last data filed at 11/14/2022 1629 Gross per 24 hour  Intake --  Output 1400 ml  Net -1400 ml   Filed Weights   11/11/22 0500 11/13/22 0500 11/14/22 0329  Weight: 62.2 kg 63.5 kg 64.8 kg    Examination:  General exam: Appears calm and comfortable  Respiratory system: Clear to auscultation anterior lung fields.  No wheezes, no crackles, no rhonchi.Marland Kitchen Respiratory effort normal. Cardiovascular system: S1 & S2 heard, RRR. No JVD, murmurs, rubs, gallops or clicks. No pedal edema. Gastrointestinal system: Abdomen is nondistended, soft and nontender. No organomegaly or masses felt. Normal bowel sounds heard. Central nervous system: Alert and oriented. No focal neurological deficits. Extremities: Symmetric 5 x 5 power. Skin: No rashes, lesions or ulcers Psychiatry: Judgement and insight appear normal. Mood & affect appropriate.     Data Reviewed: I have personally reviewed following labs and imaging studies  CBC: Recent Labs  Lab 11/10/22 1334 11/11/22 0512 11/14/22 0502  WBC 6.0 5.8 7.4  NEUTROABS 4.0  --   --   HGB 9.8* 11.0* 10.1*  HCT 33.1* 36.2 33.2*  MCV 89.7 86.2 86.9  PLT 194 221 202    Basic Metabolic Panel: Recent Labs  Lab 11/10/22 1334 11/11/22 0512 11/14/22 0502  NA 139 140 142  K 3.9 4.0 4.5  CL 109 109 110  CO2 23 28 27   GLUCOSE 90 98 92  BUN 21 19 29*  CREATININE 0.84 0.87 1.19*  CALCIUM 8.3* 8.7* 8.5*  MG  --  1.9  --     GFR: Estimated Creatinine Clearance: 22.2 mL/min (A) (by C-G formula based on SCr of 1.19 mg/dL (H)).  Liver Function Tests: Recent Labs  Lab 11/10/22 1334  AST 17  ALT 10  ALKPHOS 67  BILITOT 0.7  PROT 6.3*  ALBUMIN 3.2*    CBG: No results for input(s): "GLUCAP" in the last 168 hours.   Recent Results (from the past 240 hour(s))  Resp panel by RT-PCR (RSV, Flu A&B, Covid) Anterior Nasal Swab     Status: None   Collection Time: 11/10/22  4:44 PM    Specimen: Anterior Nasal Swab  Result Value Ref Range Status   SARS Coronavirus 2 by RT PCR NEGATIVE NEGATIVE Final    Comment: (NOTE) SARS-CoV-2 target nucleic acids are NOT DETECTED.  The SARS-CoV-2 RNA is generally detectable in upper respiratory specimens during the acute phase of infection. The lowest concentration of SARS-CoV-2 viral copies this assay can detect is 138 copies/mL. A negative result does not preclude SARS-Cov-2 infection and should not be used as the sole basis for treatment or other patient management decisions. A negative result may occur with  improper specimen collection/handling, submission of specimen other than nasopharyngeal swab, presence of viral mutation(s) within the areas targeted by this assay, and inadequate number of viral copies(<138 copies/mL). A negative result must be combined with clinical observations, patient history, and epidemiological information. The expected result is Negative.  Fact Sheet for Patients:  11/12/22  Fact Sheet for Healthcare Providers:  BloggerCourse.com  This test is no t yet approved or cleared by the [place] FDA and  has been authorized for detection and/or diagnosis of  SARS-CoV-2 by FDA under an Emergency Use Authorization (EUA). This EUA will remain  in effect (meaning this test can be used) for the duration of the COVID-19 declaration under Section 564(b)(1) of the Act, 21 U.S.C.section 360bbb-3(b)(1), unless the authorization is terminated  or revoked sooner.       Influenza A by PCR NEGATIVE NEGATIVE Final   Influenza B by PCR NEGATIVE NEGATIVE Final    Comment: (NOTE) The Xpert Xpress SARS-CoV-2/FLU/RSV plus assay is intended as an aid in the diagnosis of influenza from Nasopharyngeal swab specimens and should not be used as a sole basis for treatment. Nasal washings and aspirates are unacceptable for Xpert Xpress  SARS-CoV-2/FLU/RSV testing.  Fact Sheet for Patients: BloggerCourse.comhttps://www.fda.gov/media/152166/download  Fact Sheet for Healthcare Providers: SeriousBroker.ithttps://www.fda.gov/media/152162/download  This test is not yet approved or cleared by the [place] FDA and has been authorized for detection and/or diagnosis of SARS-CoV-2 by FDA under an Emergency Use Authorization (EUA). This EUA will remain in effect (meaning this test can be used) for the duration of the COVID-19 declaration under Section 564(b)(1) of the Act, 21 U.S.C. section 360bbb-3(b)(1), unless the authorization is terminated or revoked.     Resp Syncytial Virus by PCR NEGATIVE NEGATIVE Final    Comment: (NOTE) Fact Sheet for Patients: BloggerCourse.comhttps://www.fda.gov/media/152166/download  Fact Sheet for Healthcare Providers: SeriousBroker.ithttps://www.fda.gov/media/152162/download  This test is not yet approved or cleared by the [place] FDA and has been authorized for detection and/or diagnosis of SARS-CoV-2 by FDA under an Emergency Use Authorization (EUA). This EUA will remain in effect (meaning this test can be used) for the duration of the COVID-19 declaration under Section 564(b)(1) of the Act, 21 U.S.C. section 360bbb-3(b)(1), unless the authorization is terminated or revoked.  Performed at [facility], [address] [place], [place] [address]   Urine Culture     Status: None   Collection Time: 11/13/22  7:30 AM   Specimen: Urine, Clean Catch  Result Value Ref Range Status   Specimen Description   Final    URINE, CLEAN CATCH Performed at [facility], [address] [place], [place] [address]    Special Requests   Final    NONE Performed at [facility], [address] [place], [place] [address]    Culture   Final    NO GROWTH Performed at [facility], [address] [place], [place] [address]    Report Status 11/14/2022 FINAL  Final         Radiology Studies: No results  found.      Scheduled Meds:  aspirin  81 mg Oral Daily   cholecalciferol  1,000 Units Oral Daily   diclofenac Sodium  2 g Topical QID   docusate sodium  100 mg Oral BID   enoxaparin (LOVENOX) injection  30 mg Subcutaneous Q24H   ferrous sulfate  325 mg Oral Daily   influenza vaccine adjuvanted  0.5 mL Intramuscular Tomorrow-1000   levothyroxine  75 mcg Oral Q0600   pantoprazole  40 mg Oral Daily   venlafaxine XR  150 mg Oral Daily   venlafaxine XR  75 mg Oral Daily   Continuous Infusions:  sodium chloride 50 mL/hr at 11/14/22 0806     LOS: 2 days    Time spent: 35 minutes    Ramiro Harvestaniel Amylynn Fano, MD [facility]   To contact the attending provider between 7A-7P or the covering provider during after hours 7P-7A, please log into the web site [email or web address] and access using universal  [place] password for that web site. If you do not have the password, please call the hospital operator.  11/14/2022, 5:11 PM

## 2022-11-15 DIAGNOSIS — N3000 Acute cystitis without hematuria: Secondary | ICD-10-CM

## 2022-11-15 DIAGNOSIS — E785 Hyperlipidemia, unspecified: Secondary | ICD-10-CM

## 2022-11-15 DIAGNOSIS — G9341 Metabolic encephalopathy: Secondary | ICD-10-CM | POA: Diagnosis not present

## 2022-11-15 DIAGNOSIS — R531 Weakness: Secondary | ICD-10-CM

## 2022-11-15 LAB — BASIC METABOLIC PANEL
Anion gap: 4 — ABNORMAL LOW (ref 5–15)
BUN: 33 mg/dL — ABNORMAL HIGH (ref 8–23)
CO2: 27 mmol/L (ref 22–32)
Calcium: 8.5 mg/dL — ABNORMAL LOW (ref 8.9–10.3)
Chloride: 110 mmol/L (ref 98–111)
Creatinine, Ser: 1.03 mg/dL — ABNORMAL HIGH (ref 0.44–1.00)
GFR, Estimated: 49 mL/min — ABNORMAL LOW (ref >60)
Glucose, Bld: 90 mg/dL (ref 70–99)
Potassium: 4.3 mmol/L (ref 3.5–5.1)
Sodium: 141 mmol/L (ref 135–145)

## 2022-11-15 MED ORDER — DICLOFENAC SODIUM 1 % EX GEL: 2.0000 g | Freq: Four times a day (QID) | CUTANEOUS | 0 refills | Status: AC

## 2022-11-15 MED ORDER — CYCLOBENZAPRINE HCL 5 MG PO TABS: 5.0000 mg | ORAL_TABLET | Freq: Three times a day (TID) | ORAL | 0 refills | Status: DC | PRN

## 2022-11-15 MED ORDER — ACETAMINOPHEN 325 MG PO TABS
650.0000 mg | ORAL_TABLET | ORAL | Status: DC | PRN
  Administered 2022-11-17: 320 mg via ORAL
  Administered 2022-11-22: 650 mg via ORAL
  Filled 2022-11-15: qty 2

## 2022-11-15 NOTE — Progress Notes (Signed)
   11/15/22 1213  Medical Necessity for Transport Certificate --- IF THIS TRANSPORT IS ROUND TRIP OR SCHEDULED AND REPEATED, A PHYSICIAN MUST COMPLETE THIS FORM  Transport from: Scientist, product/process development) Geographical information systems officer to (Location) Other (Comment) ([facility] [place])  Did the patient arrive from a Skilled Nursing Facility, Assisted Living Facility or Group Home? No  Is this the closest appropriate facility? Yes  Date of Transport Service 11/15/22  Name of Transporting Agency [facility]  Round Trip Transport? No  Reason for Transport Discharge  Is this a hospice patient? No  Describe the Medical Condition UTI, thyroid disease, hypertension, hyperlipidemia, acute metabolic enchephalopathy, generalized weakness  Q1 Are ALL the following "true"? 1. Patient unable to get up from bed without assistance  AND  2. Unable to ambulate  AND  3. Unable to sit in a chair, including wheelchair. Yes  Q2 Could the patient be transported safely by other means of transportation (I.E., wheelchair van)? No  Q3 Please check any of the following conditions that apply at the time of transport: Risk of injury to self and/or others;Altered mental status  Electronic Signature Tempie Hoist  Credentials DP  Date Signed 11/15/22

## 2022-11-15 NOTE — Progress Notes (Signed)
Physical Therapy Treatment Patient Details Name: Ann Green MRN: [id number] DOB: 12/06/1921 Today's Date: 11/15/2022   History of Present Illness 88 y/o female presented to ED on 11/10/22 for AMS and UTI. Admitted for possible acute cystitis and L LE cellulitis. PMH: dementia, HTN, HLD    PT Comments    Patient supine in bed on arrival and agreeable to PT tx session. Granddaughter in room and supportive. Required modA for bed mobility and minA for sit to stand transfer. Demos slight posterior bias in standing but improved since last session and with cues provided. Ambulated 25' with RW and minA for balance as patient tends to drift to L with mobility. Encouraged continued mobility with nursing staff and mobility specialists as patient is not at her baseline. Continue to recommend SNF for ongoing Physical Therapy to assist with return to PLOF.       Recommendations for follow up therapy are one component of a multi-disciplinary discharge planning process, led by the attending physician.  Recommendations may be updated based on patient status, additional functional criteria and insurance authorization.  Follow Up Recommendations  Skilled nursing-short term rehab (<3 hours/day) Can patient physically be transported by private vehicle: No   Assistance Recommended at Discharge Frequent or constant Supervision/Assistance  Patient can return home with the following A lot of help with walking and/or transfers;A lot of help with bathing/dressing/bathroom;Direct supervision/assist for financial management;Help with stairs or ramp for entrance;Direct supervision/assist for medications management;Assistance with cooking/housework;Assist for transportation   Equipment Recommendations  None recommended by PT    Recommendations for Other Services       Precautions / Restrictions Precautions Precautions: Fall Restrictions Weight Bearing Restrictions: No     Mobility  Bed Mobility Overal bed  mobility: Needs Assistance Bed Mobility: Supine to Sit, Sit to Supine     Supine to sit: Mod assist Sit to supine: Mod assist   General bed mobility comments: assist for trunk elevation and repositioning hips towards EOB. Increased time to get LEs into bed but requires modA to reposition and trunk management    Transfers Overall transfer level: Needs assistance Equipment used: Rolling Kory Panjwani (2 wheels) Transfers: Sit to/from Stand Sit to Stand: Min assist, From elevated surface           General transfer comment: assist to boost up into standing. Patient pulling on RW to stand    Ambulation/Gait Ambulation/Gait assistance: Min assist Gait Distance (Feet): 25 Feet Assistive device: Rolling Alexi Dorminey (2 wheels) Gait Pattern/deviations: Step-to pattern, Decreased stride length, Knee flexed in stance - left, Knee flexed in stance - right, Narrow base of support Gait velocity: decreased     General Gait Details: assist for balance with slight posterior lean. Cues for anterior weight shift. Drifts towards L during mobility   Stairs             Wheelchair Mobility    Modified Rankin (Stroke Patients Only)       Balance Overall balance assessment: History of Falls, Needs assistance Sitting-balance support: Feet supported Sitting balance-Leahy Scale: Fair Sitting balance - Comments: initially with posterior lean but able to correct with cues Postural control: Posterior lean Standing balance support: Bilateral upper extremity supported, Reliant on assistive device for balance Standing balance-Leahy Scale: Poor                              Cognition Arousal/Alertness: Awake/alert Behavior During Therapy: WFL for tasks assessed/performed Overall Cognitive Status:  History of cognitive impairments - at baseline                                 General Comments: hx of dementia        Exercises      General Comments        Pertinent  Vitals/Pain Pain Assessment Pain Assessment: No/denies pain    Home Living                          Prior Function            PT Goals (current goals can now be found in the care plan section) Acute Rehab PT Goals Patient Stated Goal: did not state PT Goal Formulation: With patient Time For Goal Achievement: 11/25/22 Potential to Achieve Goals: Good Progress towards PT goals: Progressing toward goals    Frequency    Min 2X/week      PT Plan Current plan remains appropriate    Co-evaluation              AM-PAC PT "6 Clicks" Mobility   Outcome Measure  Help needed turning from your back to your side while in a flat bed without using bedrails?: A Lot Help needed moving from lying on your back to sitting on the side of a flat bed without using bedrails?: A Lot Help needed moving to and from a bed to a chair (including a wheelchair)?: A Lot Help needed standing up from a chair using your arms (e.g., wheelchair or bedside chair)?: A Little Help needed to walk in hospital room?: A Little Help needed climbing 3-5 steps with a railing? : Total 6 Click Score: 13    End of Session Equipment Utilized During Treatment: Gait belt Activity Tolerance: Patient tolerated treatment well Patient left: in bed;with call bell/phone within reach;with family/visitor present Nurse Communication: Mobility status PT Visit Diagnosis: Unsteadiness on feet (R26.81);History of falling (Z91.81);Repeated falls (R29.6);Muscle weakness (generalized) (M62.81)     Time: 7322-0254 PT Time Calculation (min) (ACUTE ONLY): 26 min  Charges:  $Gait Training: 8-22 mins $Therapeutic Activity: 8-22 mins                     Lizbet Cirrincione A. Dan Humphreys PT, DPT [facility] - Acute Rehabilitation Services    Andee Chivers A Zeynab Klett 11/15/2022, 1:33 PM

## 2022-11-15 NOTE — Progress Notes (Signed)
Discharge on hold as authorization note approved at this time per [facility].

## 2022-11-15 NOTE — Care Management Important Message (Signed)
Important Message  Patient Details  Name: Ann Green MRN: [id number] Date of Birth: 18-Dec-1921   Medicare Important Message Given:  N/A - LOS <3 / Initial given by admissions     Olegario Messier A Amada Hallisey 11/15/2022, 9:00 AM

## 2022-11-15 NOTE — TOC Progression Note (Signed)
Transition of Care [facility]) - Progression Note    Patient Details  Name: Ann Green MRN: [id number] Date of Birth: 12/01/1921  Transition of Care [facility]) CM/SW Contact  Tempie Hoist, [place] Phone Number: 11/15/2022, 1:55 PM  Clinical Narrative:      TOC uploaded the latest PT notes to the [facility] portal. The patient will need transport to [facility] after the authorization states approved.      Expected Discharge Plan and Services      SNF   Expected Discharge Date: 11/15/22                                     Social Determinants of Health (SDOH) Interventions SDOH Screenings   Food Insecurity: No Food Insecurity (11/10/2022)  Housing: Low Risk  (11/10/2022)  Transportation Needs: No Transportation Needs (11/10/2022)  Utilities: Not At Risk (11/10/2022)  Tobacco Use: Low Risk  (11/10/2022)    Readmission Risk Interventions     No data to display

## 2022-11-15 NOTE — Plan of Care (Signed)
Patient is adequate for discharge to SNF.  Transportation to [facility] via EMS.

## 2022-11-15 NOTE — Discharge Summary (Signed)
Physician Discharge Summary  Ann Green IWP:[id number] DOB: 1922-10-22 DOA: 11/10/2022  PCP: Tawnya Crook, MD  Admit date: 11/10/2022 Discharge date: 11/15/2022  Time spent: 55 minutes  Recommendations for Outpatient Follow-up:  Patient was discharged to skilled nursing facility.  Follow-up with MD at SNF.  Patient will need a basic metabolic profile done in 1 week to follow-up on electrolytes and renal function.  Patient may benefit from palliative care outpatient follow-up at facility.   Discharge Diagnoses:  Principal Problem:   UTI (urinary tract infection) Active Problems:   Thyroid disease   Essential hypertension   Hyperlipidemia   Acute metabolic encephalopathy   Generalized weakness   Discharge Condition: Stable and improved  Diet recommendation: Regular  Filed Weights   11/13/22 0500 11/14/22 0329 11/15/22 0500  Weight: 63.5 kg 64.8 kg 65 kg    History of present illness:  HPI per Dr. Mellody Drown is a 88 y.o. female with dementia, hypertension, hyperlipidemia presented to hospital with generalized weakness, fatigue and tiredness with episodes of confusion and not acting herself.  Patient lives at home and has been taken care of by caregivers.  Patient was initially prescribed Macrobid by her primary care physician and had taken 1 dose this but today she developed some slurred speech and some weakness in the left arm so she was brought to the hospital.  By the time she came to the hospital her weakness and slurred speech had improved.  Patient denies any nausea, vomiting, abdominal pain, fever or chills.   Denies any syncope or lightheadedness.  Denies any sick contacts or recent travel.  Denies any runny nose, sore throat.  Denies any urinary urgency, frequency or dysuria.  Patient complains of mild headache at the time of my exam and feels that she could not breathe but does not have any chest pain..   Initial vitals in the ED, was notable for elevated  blood pressure with some bradycardia.  Patient did have some left foot cellulitis.  Does have a history of bradycardia at baseline.  Labs in the ED showed hemoglobin of 9.8 no leukocytosis.  Creatinine 0.8.  UA showed some white cells from 11/10/2022 done outside.  Urine culture was sent from the ED.  Patient was started on Rocephin IV and was considered for admission to the hospital for generalized weakness, confusion, possible cellulitis.  Hospital Course:  #1 generalized weakness/fatigue -Likely secondary to possible UTI, dehydration. -Patient noted to have been taking care of by caretaker at home but assessment by PT recommended SNF placement at this time. -TSH, vitamin B12 low normal range.  Vitamin D levels at 53. -TOC consulted for SNF placement.   2.  Chest wall pain -Patient noted to have some chest wall pain which was reproducible on palpation, improving clinically on Voltaren gel and Flexeril as needed. -EKG done with normal sinus rhythm with no ischemic changes. -Patient be discharged on Voltaren gel x 1 week and then as needed.   3.  Possible acute cystitis -Patient noted to have been prescribed Macrobid in the outpatient setting prior to admission. -Urine cultures done during this hospitalization with no growth. -Status post 3 days IV Rocephin. -No further needed.   4.  Acute metabolic encephalopathy/mild confusion -Felt likely secondary to infectious etiology of UTI in the setting of underlying dementia. -Status post 3 days IV Rocephin. -Patient remained afebrile, improved clinically and close to baseline by day of discharge.    5.  Concern for TIA with some focal  weakness -Patient already noted to be on aspirin prior to admission. -Head CT done with no acute abnormalities. -Per Dr. Tyson BabinskiPokhrel, family did not wish to pursue any further workup including MRI as patient had improved and imaging would not likely change current management. -Patient will be maintained on aspirin on  discharge for secondary stroke prophylaxis.   -Outpatient follow-up.    6.  Hypothyroidism -Patient maintained on home regimen Synthroid.    7.  History of dementia -Being cared by caregivers. -Patient maintained on home regimen Effexor.   -Patient seen by physical therapy SNF recommended and patient will be discharged to skilled nursing facility.   -Patient will need palliative care to follow at facility.    8.  Dehydration -Patient with slight bump in BUN/creatinine on 11/14/2022.. -Per family patient noted with some poor oral intake. -Patient hydrated gently with improvement with renal function by day of discharge.        Procedures: CT head 11/10/2022   Consultations: None  Discharge Exam: Vitals:   11/15/22 0653 11/15/22 0729  BP: (!) 147/63 (!) 154/79  Pulse: 72 66  Resp: 16 17  Temp: 97.7 F (36.5 C) 98.3 F (36.8 C)  SpO2: 99% 98%    General: NAD. Cardiovascular: RRR no murmurs rubs or gallops.  No JVD.  No lower extremity edema.Chest wall with some TTP. Respiratory: Clear to auscultation bilaterally.  No wheezes, no crackles, no rhonchi.  Fair air movement.  Speaking in full sentences.  Discharge Instructions   Discharge Instructions     Call MD for:  persistant nausea and vomiting   Complete by: As directed    Call MD for:  severe uncontrolled pain   Complete by: As directed    Call MD for:  temperature >100.4   Complete by: As directed    Diet general   Complete by: As directed    Discharge instructions   Complete by: As directed    Follow-up with your primary care provider in 1 week.  Complete the course of antibiotic that you have at home.  Increase fluid intake.  Seek medical attention for worsening symptoms.   Increase activity slowly   Complete by: As directed    Increase activity slowly   Complete by: As directed       Allergies as of 11/15/2022       Reactions   Codeine Other (See Comments)   Other reaction(s): Hallucination         Medication List     STOP taking these medications    nitrofurantoin (macrocrystal-monohydrate) 100 MG capsule Commonly known as: MACROBID       TAKE these medications    acetaminophen 500 MG tablet Commonly known as: TYLENOL Take 500-1,000 mg by mouth daily.   aspirin 81 MG chewable tablet Chew 81 mg by mouth daily.   cholecalciferol 25 MCG (1000 UNIT) tablet Commonly known as: VITAMIN D3 Take 1,000 Units by mouth daily.   cyclobenzaprine 5 MG tablet Commonly known as: FLEXERIL Take 1 tablet (5 mg total) by mouth 3 (three) times daily as needed for muscle spasms.   diclofenac Sodium 1 % Gel Commonly known as: VOLTAREN Apply 2 g topically 4 (four) times daily for 7 days. Apply to chest wall   ferrous sulfate 325 (65 FE) MG tablet Take 325 mg by mouth every morning.   levothyroxine 75 MCG tablet Commonly known as: SYNTHROID Take 75 mcg by mouth daily.   omeprazole 20 MG capsule Commonly known as: PRILOSEC Take  20 mg by mouth daily.   venlafaxine XR 150 MG 24 hr capsule Commonly known as: EFFEXOR-XR Take 150 mg by mouth daily. (take with  dose to equal  daily)   venlafaxine XR 75 MG 24 hr capsule Commonly known as: EFFEXOR-XR Take 75 mg by mouth daily. (take with  dose to equal  daily)       Allergies  Allergen Reactions   Codeine Other (See Comments)    Other reaction(s): Hallucination     Contact information for follow-up providers     Tawnya Crook, MD Follow up in 1 week(s).   Specialty: Internal Medicine Contact information: [address] [place] [address] [phone]         MD at SNF Follow up.               Contact information for after-discharge care     Destination     [facility] Preferred SNF .   Service: Skilled Nursing Contact information: [address] [place] [address] [phone]                      The results of  significant diagnostics from this hospitalization (including imaging, microbiology, ancillary and laboratory) are listed below for reference.    Significant Diagnostic Studies: CT HEAD WO CONTRAST ( )  Result Date: 11/10/2022 CLINICAL DATA:  Mental status change EXAM: CT HEAD WITHOUT CONTRAST TECHNIQUE: Contiguous axial images were obtained from the base of the skull through the vertex without intravenous contrast. RADIATION DOSE REDUCTION: This exam was performed according to the departmental dose-optimization program which includes automated exposure control, adjustment of the mA and/or kV according to patient size and/or use of iterative reconstruction technique. COMPARISON:  CT brain 05/24/2020 FINDINGS: Brain: No acute territorial infarction, hemorrhage, or intracranial mass. Moderate atrophy. Moderate severe chronic small vessel ischemic changes of the white matter. Chronic lacunar infarcts in the basal ganglia. Stable ventricle size. Vascular: No hyperdense vessels.  Carotid vascular calcification Skull: Normal. Negative for fracture or focal lesion. Sinuses/Orbits: No acute finding. Other: None IMPRESSION: 1. No CT evidence for acute intracranial abnormality. 2. Atrophy and chronic small vessel ischemic changes of the white matter. Electronically Signed   By: Jasmine Pang M.D.   On: 11/10/2022 16:00    Microbiology: Recent Results (from the past 240 hour(s))  Resp panel by RT-PCR (RSV, Flu A&B, Covid) Anterior Nasal Swab     Status: None   Collection Time: 11/10/22  4:44 PM   Specimen: Anterior Nasal Swab  Result Value Ref Range Status   SARS Coronavirus 2 by RT PCR NEGATIVE NEGATIVE Final    Comment: (NOTE) SARS-CoV-2 target nucleic acids are NOT DETECTED.  The SARS-CoV-2 RNA is generally detectable in upper respiratory specimens during the acute phase of infection. The lowest concentration of SARS-CoV-2 viral copies this assay can detect is 138 copies/mL. A negative result does not  preclude SARS-Cov-2 infection and should not be used as the sole basis for treatment or other patient management decisions. A negative result may occur with  improper specimen collection/handling, submission of specimen other than nasopharyngeal swab, presence of viral mutation(s) within the areas targeted by this assay, and inadequate number of viral copies(<138 copies/mL). A negative result must be combined with clinical observations, patient history, and epidemiological information. The expected result is Negative.  Fact Sheet for Patients:  BloggerCourse.com  Fact Sheet for Healthcare Providers:  SeriousBroker.it  This test is no t  yet approved or cleared by the [place] and  has been authorized for detection and/or diagnosis of SARS-CoV-2 by FDA under an Emergency Use Authorization (EUA). This EUA will remain  in effect (meaning this test can be used) for the duration of the COVID-19 declaration under Section 564(b)(1) of the Act, 21 U.S.C.section 360bbb-3(b)(1), unless the authorization is terminated  or revoked sooner.       Influenza A by PCR NEGATIVE NEGATIVE Final   Influenza B by PCR NEGATIVE NEGATIVE Final    Comment: (NOTE) The Xpert Xpress SARS-CoV-2/FLU/RSV plus assay is intended as an aid in the diagnosis of influenza from Nasopharyngeal swab specimens and should not be used as a sole basis for treatment. Nasal washings and aspirates are unacceptable for Xpert Xpress SARS-CoV-2/FLU/RSV testing.  Fact Sheet for Patients: BloggerCourse.com  Fact Sheet for Healthcare Providers: SeriousBroker.it  This test is not yet approved or cleared by the [place] FDA and has been authorized for detection and/or diagnosis of SARS-CoV-2 by FDA under an Emergency Use Authorization (EUA). This EUA will remain in effect (meaning this test can be used) for the  duration of the COVID-19 declaration under Section 564(b)(1) of the Act, 21 U.S.C. section 360bbb-3(b)(1), unless the authorization is terminated or revoked.     Resp Syncytial Virus by PCR NEGATIVE NEGATIVE Final    Comment: (NOTE) Fact Sheet for Patients: BloggerCourse.com  Fact Sheet for Healthcare Providers: SeriousBroker.it  This test is not yet approved or cleared by the [place] FDA and has been authorized for detection and/or diagnosis of SARS-CoV-2 by FDA under an Emergency Use Authorization (EUA). This EUA will remain in effect (meaning this test can be used) for the duration of the COVID-19 declaration under Section 564(b)(1) of the Act, 21 U.S.C. section 360bbb-3(b)(1), unless the authorization is terminated or revoked.  Performed at [facility], [address] [place], [place] [address]   Urine Culture     Status: None   Collection Time: 11/13/22  7:30 AM   Specimen: Urine, Clean Catch  Result Value Ref Range Status   Specimen Description   Final    URINE, CLEAN CATCH Performed at [facility], [address], [place], [place] [address]    Special Requests   Final    NONE Performed at [facility], [address] [place], [place] [address]    Culture   Final    NO GROWTH Performed at [facility], [address], [place], [place] [address]    Report Status 11/14/2022 FINAL  Final     Labs: Basic Metabolic Panel: Recent Labs  Lab 11/10/22 1334 11/11/22 0512 11/14/22 0502 11/15/22 0422  NA 139 140 142 141  K 3.9 4.0 4.5 4.3  CL 109 109 110 110  CO2 23 28 27 27   GLUCOSE 90 98 92 90  BUN 21 19 29* 33*  CREATININE 0.84 0.87 1.19* 1.03*  CALCIUM 8.3* 8.7* 8.5* 8.5*  MG  --  1.9  --   --    Liver Function Tests: Recent Labs  Lab 11/10/22 1334  AST 17  ALT 10  ALKPHOS 67  BILITOT 0.7  PROT 6.3*  ALBUMIN 3.2*   No results for input(s): "LIPASE",  "AMYLASE" in the last 168 hours. No results for input(s): "AMMONIA" in the last 168 hours. CBC: Recent Labs  Lab 11/10/22 1334 11/11/22 0512 11/14/22 0502  WBC 6.0 5.8 7.4  NEUTROABS 4.0  --   --   HGB  9.8* 11.0* 10.1*  HCT 33.1* 36.2 33.2*  MCV 89.7 86.2 86.9  PLT 194 221 202   Cardiac Enzymes: No results for input(s): "CKTOTAL", "CKMB", "CKMBINDEX", "TROPONINI" in the last 168 hours. BNP: BNP (last 3 results) No results for input(s): "BNP" in the last 8760 hours.  ProBNP (last 3 results) No results for input(s): "PROBNP" in the last 8760 hours.  CBG: No results for input(s): "GLUCAP" in the last 168 hours.     Signed:  Ramiro Harvest MD.  [facility] 11/15/2022, 12:07 PM

## 2022-11-16 DIAGNOSIS — G9341 Metabolic encephalopathy: Secondary | ICD-10-CM | POA: Diagnosis not present

## 2022-11-16 DIAGNOSIS — R531 Weakness: Secondary | ICD-10-CM | POA: Diagnosis not present

## 2022-11-16 DIAGNOSIS — E079 Disorder of thyroid, unspecified: Secondary | ICD-10-CM | POA: Diagnosis not present

## 2022-11-16 DIAGNOSIS — N390 Urinary tract infection, site not specified: Secondary | ICD-10-CM | POA: Diagnosis not present

## 2022-11-16 MED ORDER — OLANZAPINE 5 MG PO TBDP
2.5000 mg | ORAL_TABLET | Freq: Every day | ORAL | Status: DC
  Administered 2022-11-16: 2.5 mg via ORAL
  Filled 2022-11-16: qty 0.5

## 2022-11-16 MED ORDER — ACETAMINOPHEN 500 MG PO TABS
500.0000 mg | ORAL_TABLET | Freq: Every day | ORAL | Status: DC
  Administered 2022-11-16: 500 mg via ORAL
  Filled 2022-11-16: qty 1

## 2022-11-16 NOTE — TOC Progression Note (Signed)
Transition of Care [facility]) - Progression Note    Patient Details  Name: Ann Green MRN: [id number] Date of Birth: [birth year]  [facility]) CM/SW Contact  Darleene Cleaver, [place] Phone Number: 11/16/2022, 5:00 PM  Clinical Narrative:     Fransico Him requested a peer to peer.  Attending physician notified, and completed peer to peer with Manufacturing engineer.  Patient still denied for SNF placement, this writer notified TOC worker to update family and give them the option to appeal decision from [employer], pay privately or plan for home with home health.  TOC to continue to follow patient's progress throughout discharge planning.       Expected Discharge Plan and Services         Expected Discharge Date: 11/15/22                                     Social Determinants of Health (SDOH) Interventions SDOH Screenings   Food Insecurity: No Food Insecurity (11/10/2022)  Housing: Low Risk  (11/10/2022)  Transportation Needs: No Transportation Needs (11/10/2022)  Utilities: Not At Risk (11/10/2022)  Tobacco Use: Low Risk  (11/10/2022)    Readmission Risk Interventions     No data to display

## 2022-11-16 NOTE — TOC Progression Note (Addendum)
Transition of Care [facility]) - Progression Note    Patient Details  Name: Ann Green MRN: [id number] Date of Birth: 11/07/1922  Transition of Care [facility]) CM/SW Contact  Tempie Hoist, [place] Phone Number: 11/16/2022, 10:23 AM  Clinical Narrative:     [facility] would like additional PT documentation regarding the PLOF. TOC notified PT. [employer] phone number is [phone].   The authorization is now being sent to the [facility] for review.  [employer] phone number for a fast appeal is [phone]. TOC to provide the following options to family: home with home health, private pay at SNF or fast appeal.   The patient would like to complete the fast appeal. TOC provided them the number for a fast appeal.  Expected Discharge Plan and Services    SNF     Expected Discharge Date: 11/15/22                                     Social Determinants of Health (SDOH) Interventions SDOH Screenings   Food Insecurity: No Food Insecurity (11/10/2022)  Housing: Low Risk  (11/10/2022)  Transportation Needs: No Transportation Needs (11/10/2022)  Utilities: Not At Risk (11/10/2022)  Tobacco Use: Low Risk  (11/10/2022)    Readmission Risk Interventions     No data to display

## 2022-11-16 NOTE — Care Management Important Message (Signed)
Important Message  Patient Details  Name: Ann Green MRN: [id number] Date of Birth: 1922-01-13   Medicare Important Message Given:  Yes     Olegario Messier A Darnelle Derrick 11/16/2022, 1:54 PM

## 2022-11-16 NOTE — Progress Notes (Addendum)
PROGRESS NOTE    Ann Green  DDU:[id number] DOB: Aug 09, 1922 DOA: 11/10/2022 PCP: Tawnya Crook, MD   No chief complaint on file.   Brief Narrative:  Ann Green is a 88 y.o. female with dementia, hypertension, hyperlipidemia presented to the hospital with generalized weakness, fatigue and tiredness with episodes of confusion and not acting herself.  Patient also had slurred speech and some weakness in the left arm so she was brought to the hospital.  By the time she came to the hospital, her weakness and slurred speech had improved. In the ED, patient had elevated blood pressure with some bradycardia.  Does have a history of bradycardia at baseline.  Labs in the ED, showed hemoglobin of 9.8,  no leukocytosis.  Creatinine was 0.8.  UA showed some white cells from 11/10/2022, done from outside.  Patient was started on Rocephin IV and was considered for admission to the hospital for generalized weakness, confusion, UTI.   At this time, patient has been seen by physical therapy who recommended skilled nursing facility placement.  Medically stable for disposition.   Assessment & Plan:   Principal Problem:   UTI (urinary tract infection) Active Problems:   Thyroid disease   Essential hypertension   Hyperlipidemia   Acute metabolic encephalopathy   Generalized weakness   Weakness  #1 generalized weakness/fatigue -Likely secondary to possible UTI, dehydration. -Patient noted to have been taking care of by caretaker at home but assessment by PT recommending SNF placement at this time. -TSH, vitamin B12 low normal range.  Vitamin D levels at 53. -TOC consulted for SNF placement.  2.  Chest wall pain -Patient noted to have some chest wall pain which was reproducible on palpation, improving clinically. -EKG done with normal sinus rhythm with no ischemic changes. -Continue diclofenac gel.  3.  Possible acute cystitis -Patient noted to have been prescribed Macrobid in the outpatient  setting prior to admission. -Urine cultures done during this hospitalization with no growth. -Status post 3 days IV Rocephin. -No further needed.  4.  Acute metabolic encephalopathy/mild confusion -Felt likely secondary to infectious etiology of UTI in the setting of underlying dementia. -Status post 3 days IV Rocephin. -Patient currently afebrile. -Clinical improvement, currently at baseline.  5.  Concern for TIA with some focal weakness -Patient already noted to be on aspirin prior to admission. -Head CT done with no acute abnormalities. -Per Dr. Tyson Babinski, family did not wish to pursue any further workup including MRI as patient had improved and imaging would not likely change current management. -Continue aspirin on discharge for secondary stroke prophylaxis.  6.  Hypothyroidism -Synthroid.  -Outpatient follow-up.  7.  History of dementia -Per family patient noted with some bouts of confusion overnight and some vivid dreams. -Being cared by caregivers. -Continue Effexor. -Place on Zyprexa 2.5 mg nightly and Tylenol 500 mg nightly. -Supportive care. -Plan for SNF placement.  8.  Dehydration -Patient with slight bump in BUN/creatinine. -Per family patient noted with some poor oral intake. -Status post gentle hydration.  -Oral intake improving.   DVT prophylaxis: Lovenox Code Status: DNR Family Communication: Updated family, granddaughter at bedside. Disposition: Awaiting SNF placement.  Status is: Inpatient Remains inpatient appropriate because: Unsafe disposition   Consultants:  None  Procedures: CT head 11/10/2022   Antimicrobials:  IV Rocephin 11/10/2022>>>> 11/13/2022   Subjective: Pleasant lady sitting up in chair alert to self place and time.  Pleasant lady, alert oriented to self place and time.  Denies any significant chest pain.  No shortness of breath.  Family at bedside.  Awaiting SNF placement.  Per daughter patient noted with some bouts of  confusion overnight.  Objective: Vitals:   11/15/22 1929 11/16/22 0438 11/16/22 0500 11/16/22 0945  BP: (!) 145/66 (!) 162/62  (!) 168/69  Pulse: 74 62  66  Resp: 17 19  16   Temp: 98.2 F (36.8 C) 98.1 F (36.7 C)  98 F (36.7 C)  TempSrc:      SpO2: 98% 98%  97%  Weight:   65.2 kg   Height:        Intake/Output Summary (Last 24 hours) at 11/16/2022 1335 Last data filed at 11/15/2022 1426 Gross per 24 hour  Intake --  Output 500 ml  Net -500 ml    Filed Weights   11/14/22 0329 11/15/22 0500 11/16/22 0500  Weight: 64.8 kg 65 kg 65.2 kg    Examination:  General exam: NAD Respiratory system: Lungs clear to auscultation bilaterally.  No wheezes, no crackles, no rhonchi.  Fair air movement.  Speaking in full sentences.   Cardiovascular system: Regular rate and rhythm no murmurs rubs or gallops.  No JVD.  No lower extremity edema.  Decrease chest wall tenderness to palpation. Gastrointestinal system: Abdomen is soft, nontender, nondistended, positive bowel sounds.  No rebound.  No guarding.  Central nervous system: Alert and oriented. No focal neurological deficits. Extremities: Symmetric 5 x 5 power. Skin: No rashes, lesions or ulcers Psychiatry: Judgement and insight appear normal. Mood & affect appropriate.     Data Reviewed: I have personally reviewed following labs and imaging studies  CBC: Recent Labs  Lab 11/10/22 1334 11/11/22 0512 11/14/22 0502  WBC 6.0 5.8 7.4  NEUTROABS 4.0  --   --   HGB 9.8* 11.0* 10.1*  HCT 33.1* 36.2 33.2*  MCV 89.7 86.2 86.9  PLT 194 221 202     Basic Metabolic Panel: Recent Labs  Lab 11/10/22 1334 11/11/22 0512 11/14/22 0502 11/15/22 0422  NA 139 140 142 141  K 3.9 4.0 4.5 4.3  CL 109 109 110 110  CO2 23 28 27 27   GLUCOSE 90 98 92 90  BUN 21 19 29* 33*  CREATININE 0.84 0.87 1.19* 1.03*  CALCIUM 8.3* 8.7* 8.5* 8.5*  MG  --  1.9  --   --      GFR: Estimated Creatinine Clearance: 25.7 mL/min (A) (by C-G formula  based on SCr of 1.03 mg/dL (H)).  Liver Function Tests: Recent Labs  Lab 11/10/22 1334  AST 17  ALT 10  ALKPHOS 67  BILITOT 0.7  PROT 6.3*  ALBUMIN 3.2*     CBG: No results for input(s): "GLUCAP" in the last 168 hours.   Recent Results (from the past 240 hour(s))  Resp panel by RT-PCR (RSV, Flu A&B, Covid) Anterior Nasal Swab     Status: None   Collection Time: 11/10/22  4:44 PM   Specimen: Anterior Nasal Swab  Result Value Ref Range Status   SARS Coronavirus 2 by RT PCR NEGATIVE NEGATIVE Final    Comment: (NOTE) SARS-CoV-2 target nucleic acids are NOT DETECTED.  The SARS-CoV-2 RNA is generally detectable in upper respiratory specimens during the acute phase of infection. The lowest concentration of SARS-CoV-2 viral copies this assay can detect is 138 copies/mL. A negative result does not preclude SARS-Cov-2 infection and should not be used as the sole basis for treatment or other patient management decisions. A negative result may occur with  improper specimen collection/handling, submission  of specimen other than nasopharyngeal swab, presence of viral mutation(s) within the areas targeted by this assay, and inadequate number of viral copies(<138 copies/mL). A negative result must be combined with clinical observations, patient history, and epidemiological information. The expected result is Negative.  Fact Sheet for Patients:  BloggerCourse.comhttps://www.fda.gov/media/152166/download  Fact Sheet for Healthcare Providers:  SeriousBroker.ithttps://www.fda.gov/media/152162/download  This test is no t yet approved or cleared by the [place] FDA and  has been authorized for detection and/or diagnosis of SARS-CoV-2 by FDA under an Emergency Use Authorization (EUA). This EUA will remain  in effect (meaning this test can be used) for the duration of the COVID-19 declaration under Section 564(b)(1) of the Act, 21 U.S.C.section 360bbb-3(b)(1), unless the authorization is terminated  or revoked  sooner.       Influenza A by PCR NEGATIVE NEGATIVE Final   Influenza B by PCR NEGATIVE NEGATIVE Final    Comment: (NOTE) The Xpert Xpress SARS-CoV-2/FLU/RSV plus assay is intended as an aid in the diagnosis of influenza from Nasopharyngeal swab specimens and should not be used as a sole basis for treatment. Nasal washings and aspirates are unacceptable for Xpert Xpress SARS-CoV-2/FLU/RSV testing.  Fact Sheet for Patients: BloggerCourse.comhttps://www.fda.gov/media/152166/download  Fact Sheet for Healthcare Providers: SeriousBroker.ithttps://www.fda.gov/media/152162/download  This test is not yet approved or cleared by the [place] FDA and has been authorized for detection and/or diagnosis of SARS-CoV-2 by FDA under an Emergency Use Authorization (EUA). This EUA will remain in effect (meaning this test can be used) for the duration of the COVID-19 declaration under Section 564(b)(1) of the Act, 21 U.S.C. section 360bbb-3(b)(1), unless the authorization is terminated or revoked.     Resp Syncytial Virus by PCR NEGATIVE NEGATIVE Final    Comment: (NOTE) Fact Sheet for Patients: BloggerCourse.comhttps://www.fda.gov/media/152166/download  Fact Sheet for Healthcare Providers: SeriousBroker.ithttps://www.fda.gov/media/152162/download  This test is not yet approved or cleared by the [place] FDA and has been authorized for detection and/or diagnosis of SARS-CoV-2 by FDA under an Emergency Use Authorization (EUA). This EUA will remain in effect (meaning this test can be used) for the duration of the COVID-19 declaration under Section 564(b)(1) of the Act, 21 U.S.C. section 360bbb-3(b)(1), unless the authorization is terminated or revoked.  Performed at [facility], [address] [place], [place] [address]   Urine Culture     Status: None   Collection Time: 11/13/22  7:30 AM   Specimen: Urine, Clean Catch  Result Value Ref Range Status   Specimen Description   Final    URINE, CLEAN CATCH Performed at [facility], [address] [place], [place] [address]    Special Requests   Final    NONE Performed at [facility], [address] [place], [place] [address]    Culture   Final    NO GROWTH Performed at [facility], [address] [place], [place] [address]    Report Status 11/14/2022 FINAL  Final         Radiology Studies: No results found.      Scheduled Meds:  aspirin  81 mg Oral Daily   cholecalciferol  1,000 Units Oral Daily   diclofenac Sodium  2 g Topical QID   docusate sodium  100 mg Oral BID   enoxaparin (LOVENOX) injection  30 mg Subcutaneous Q24H   ferrous sulfate  325 mg Oral Daily   influenza vaccine adjuvanted  0.5 mL Intramuscular Tomorrow-1000   levothyroxine  75 mcg Oral Q0600   pantoprazole  40  mg Oral Daily   venlafaxine XR  150 mg Oral Daily   venlafaxine XR  75 mg Oral Daily   Continuous Infusions:     LOS: 4 days    Time spent: 35 minutes    Ramiro Harvest, MD [facility]   To contact the attending provider between 7A-7P or the covering provider during after hours 7P-7A, please log into the web site [email or web address] and access using universal [place] password for that web site. If you do not have the password, please call the hospital operator.  11/16/2022, 1:35 PM

## 2022-11-16 NOTE — Progress Notes (Signed)
Mobility Specialist - Progress Note   11/16/22 1200  Mobility  Activity Ambulated with assistance in room  Level of Assistance Standby assist, set-up cues, supervision of patient - no hands on  Assistive Device Front wheel walker  Distance Ambulated (ft) 40 ft  Activity Response Tolerated well  $Mobility charge 1 Mobility     Pt lying in bed upon arrival, utilizing RA. Pt a little fatigued, but agreeable to session. Pt completed bed mobility with minA. VC for foot placement to come into standing, CGA STS with no post lean noted this date. Pt ambulated 20' x 2 with minG. No drifting but 2 'step-backs' noted during ambulation, but no overt LOB. Narrow BOS with cross-over gait during turns. Pt left in recliner upon arrival, with alarm set, needs in reach. Granddaughter at bedside.    Filiberto Pinks Mobility Specialist 11/16/22, 12:10 PM

## 2022-11-17 DIAGNOSIS — N390 Urinary tract infection, site not specified: Secondary | ICD-10-CM | POA: Diagnosis not present

## 2022-11-17 DIAGNOSIS — R531 Weakness: Secondary | ICD-10-CM | POA: Diagnosis not present

## 2022-11-17 DIAGNOSIS — F03918 Unspecified dementia, unspecified severity, with other behavioral disturbance: Secondary | ICD-10-CM

## 2022-11-17 DIAGNOSIS — E079 Disorder of thyroid, unspecified: Secondary | ICD-10-CM | POA: Diagnosis not present

## 2022-11-17 DIAGNOSIS — G9341 Metabolic encephalopathy: Secondary | ICD-10-CM | POA: Diagnosis not present

## 2022-11-17 MED ORDER — ACETAMINOPHEN 325 MG PO TABS
325.0000 mg | ORAL_TABLET | Freq: Every day | ORAL | Status: DC
  Administered 2022-11-18: 325 mg via ORAL
  Filled 2022-11-17 (×2): qty 1

## 2022-11-17 NOTE — Progress Notes (Signed)
PROGRESS NOTE    Ann Green  TGG:[id number] DOB: 1922/04/02 DOA: 11/10/2022 PCP: Tawnya Crook, MD   No chief complaint on file.   Brief Narrative:  Ann Green is a 88 y.o. female with dementia, hypertension, hyperlipidemia presented to the hospital with generalized weakness, fatigue and tiredness with episodes of confusion and not acting herself.  Patient also had slurred speech and some weakness in the left arm so she was brought to the hospital.  By the time she came to the hospital, her weakness and slurred speech had improved. In the ED, patient had elevated blood pressure with some bradycardia.  Does have a history of bradycardia at baseline.  Labs in the ED, showed hemoglobin of 9.8,  no leukocytosis.  Creatinine was 0.8.  UA showed some white cells from 11/10/2022, done from outside.  Patient was started on Rocephin IV and was considered for admission to the hospital for generalized weakness, confusion, UTI.   At this time, patient has been seen by physical therapy who recommended skilled nursing facility placement.  Medically stable for disposition.   Assessment & Plan:   Principal Problem:   UTI (urinary tract infection) Active Problems:   Thyroid disease   Essential hypertension   Hyperlipidemia   Acute metabolic encephalopathy   Generalized weakness   Weakness  #1 generalized weakness/fatigue -Likely secondary to possible UTI, dehydration. -Patient noted to have been taking care of by caretaker at home but assessment by PT recommending SNF placement at this time. -TSH, vitamin B12 low normal range.  Vitamin D levels at 53. -TOC consulted for SNF placement. -Insurance declined SNF placement, I engaged in the peer to peer with MD at insurance company on 11/16/2022 and SNF placement declined at that time. -Family have appealed which is in process.  2.  Chest wall pain -Patient noted to have some chest wall pain which was reproducible on palpation, improving  clinically. -EKG done with normal sinus rhythm with no ischemic changes. -Change diclofenac gel to as needed.  3.  Possible acute cystitis -Patient noted to have been prescribed Macrobid in the outpatient setting prior to admission. -Urine cultures done during this hospitalization with no growth. -Status post 3 days IV Rocephin. -No further needed.  4.  Acute metabolic encephalopathy/mild confusion -Felt likely secondary to infectious etiology of UTI in the setting of underlying dementia. -Status post 3 days IV Rocephin. -Patient currently afebrile. -Improved clinically.  Close to baseline.   5.  Concern for TIA with some focal weakness -Patient already noted to be on aspirin prior to admission. -Head CT done with no acute abnormalities. -Per Dr. Tyson Babinski, family did not wish to pursue any further workup including MRI as patient had improved and imaging would not likely change current management. -Continue aspirin on discharge for secondary stroke prophylaxis.  6.  Hypothyroidism -Continue Synthroid. -Outpatient follow-up.  7.  History of dementia -Per family patient noted with some bouts of confusion and some vivid dreams 2 nights ago.. -Being cared by caregivers. -Continue Effexor. -Patient placed on Zyprexa 2.5 mg nightly and Tylenol 500 mg nightly last night 11/16/2022 however per daughter patient slept well, confusion improved however patient sleeping most of this morning and feels medication may be a little bit too strong.   -Discontinue Zyprexa.   -Change Tylenol to 325 mg nightly.   -Supportive care.   8.  Dehydration -Patient with slight bump in BUN/creatinine. -Per family patient noted with some poor oral intake. -Status post gentle hydration.  -Oral intake  improving. -Follow-up.   DVT prophylaxis: Lovenox Code Status: DNR Family Communication: Updated family, granddaughter at bedside. Disposition: Insurance declined SNF, appeal process pending.   Status is:  Inpatient Remains inpatient appropriate because: Unsafe disposition.  Appeal process pending.   Consultants:  None  Procedures: CT head 11/10/2022   Antimicrobials:  IV Rocephin 11/10/2022>>>> 11/13/2022   Subjective: Patient sleeping.  Arousable.  Denies any significant chest pain.  No shortness of breath.  No abdominal pain.  Granddaughter at bedside states patient slept well last night after Zyprexa and Tylenol however patient still sleeping this morning.    Objective: Vitals:   11/16/22 2048 11/17/22 0437 11/17/22 0454 11/17/22 0811  BP: (!) 143/58 (!) 117/55  121/71  Pulse: 69 61  (!) 54  Resp: 20 20  16   Temp: 98.1 F (36.7 C) 97.6 F (36.4 C)  98.7 F (37.1 C)  TempSrc: Oral   Oral  SpO2:  98%  97%  Weight:   64 kg   Height:        Intake/Output Summary (Last 24 hours) at 11/17/2022 1242 Last data filed at 11/17/2022 1034 Gross per 24 hour  Intake 360 ml  Output 700 ml  Net -340 ml    Filed Weights   11/15/22 0500 11/16/22 0500 11/17/22 0454  Weight: 65 kg 65.2 kg 64 kg    Examination:  General exam: NAD Respiratory system: CTA B anterior lung fields.  No wheezes, no crackles, no rhonchi.  Fair air movement.  Speaking in full sentences.  Cardiovascular system: RRR no murmurs rubs or gallops.  No JVD.  No lower extremity edema.  Decreased chest wall tenderness to palpation.  Gastrointestinal system: Abdomen is soft, nontender, nondistended, positive bowel sounds.  No rebound.  No guarding.  Central nervous system: Alert and oriented. No focal neurological deficits. Extremities: Symmetric 5 x 5 power. Skin: No rashes, lesions or ulcers Psychiatry: Judgement and insight appear normal. Mood & affect appropriate.     Data Reviewed: I have personally reviewed following labs and imaging studies  CBC: Recent Labs  Lab 11/10/22 1334 11/11/22 0512 11/14/22 0502  WBC 6.0 5.8 7.4  NEUTROABS 4.0  --   --   HGB 9.8* 11.0* 10.1*  HCT 33.1* 36.2 33.2*   MCV 89.7 86.2 86.9  PLT 194 221 202     Basic Metabolic Panel: Recent Labs  Lab 11/10/22 1334 11/11/22 0512 11/14/22 0502 11/15/22 0422  NA 139 140 142 141  K 3.9 4.0 4.5 4.3  CL 109 109 110 110  CO2 23 28 27 27   GLUCOSE 90 98 92 90  BUN 21 19 29* 33*  CREATININE 0.84 0.87 1.19* 1.03*  CALCIUM 8.3* 8.7* 8.5* 8.5*  MG  --  1.9  --   --      GFR: Estimated Creatinine Clearance: 25.5 mL/min (A) (by C-G formula based on SCr of 1.03 mg/dL (H)).  Liver Function Tests: Recent Labs  Lab 11/10/22 1334  AST 17  ALT 10  ALKPHOS 67  BILITOT 0.7  PROT 6.3*  ALBUMIN 3.2*     CBG: No results for input(s): "GLUCAP" in the last 168 hours.   Recent Results (from the past 240 hour(s))  Resp panel by RT-PCR (RSV, Flu A&B, Covid) Anterior Nasal Swab     Status: None   Collection Time: 11/10/22  4:44 PM   Specimen: Anterior Nasal Swab  Result Value Ref Range Status   SARS Coronavirus 2 by RT PCR NEGATIVE NEGATIVE Final  Comment: (NOTE) SARS-CoV-2 target nucleic acids are NOT DETECTED.  The SARS-CoV-2 RNA is generally detectable in upper respiratory specimens during the acute phase of infection. The lowest concentration of SARS-CoV-2 viral copies this assay can detect is 138 copies/mL. A negative result does not preclude SARS-Cov-2 infection and should not be used as the sole basis for treatment or other patient management decisions. A negative result may occur with  improper specimen collection/handling, submission of specimen other than nasopharyngeal swab, presence of viral mutation(s) within the areas targeted by this assay, and inadequate number of viral copies(<138 copies/mL). A negative result must be combined with clinical observations, patient history, and epidemiological information. The expected result is Negative.  Fact Sheet for Patients:  BloggerCourse.com  Fact Sheet for Healthcare Providers:   SeriousBroker.it  This test is no t yet approved or cleared by the [place] FDA and  has been authorized for detection and/or diagnosis of SARS-CoV-2 by FDA under an Emergency Use Authorization (EUA). This EUA will remain  in effect (meaning this test can be used) for the duration of the COVID-19 declaration under Section 564(b)(1) of the Act, 21 U.S.C.section 360bbb-3(b)(1), unless the authorization is terminated  or revoked sooner.       Influenza A by PCR NEGATIVE NEGATIVE Final   Influenza B by PCR NEGATIVE NEGATIVE Final    Comment: (NOTE) The Xpert Xpress SARS-CoV-2/FLU/RSV plus assay is intended as an aid in the diagnosis of influenza from Nasopharyngeal swab specimens and should not be used as a sole basis for treatment. Nasal washings and aspirates are unacceptable for Xpert Xpress SARS-CoV-2/FLU/RSV testing.  Fact Sheet for Patients: BloggerCourse.com  Fact Sheet for Healthcare Providers: SeriousBroker.it  This test is not yet approved or cleared by the [place] FDA and has been authorized for detection and/or diagnosis of SARS-CoV-2 by FDA under an Emergency Use Authorization (EUA). This EUA will remain in effect (meaning this test can be used) for the duration of the COVID-19 declaration under Section 564(b)(1) of the Act, 21 U.S.C. section 360bbb-3(b)(1), unless the authorization is terminated or revoked.     Resp Syncytial Virus by PCR NEGATIVE NEGATIVE Final    Comment: (NOTE) Fact Sheet for Patients: BloggerCourse.com  Fact Sheet for Healthcare Providers: SeriousBroker.it  This test is not yet approved or cleared by the [place] FDA and has been authorized for detection and/or diagnosis of SARS-CoV-2 by FDA under an Emergency Use Authorization (EUA). This EUA will remain in effect (meaning this test can be used) for  the duration of the COVID-19 declaration under Section 564(b)(1) of the Act, 21 U.S.C. section 360bbb-3(b)(1), unless the authorization is terminated or revoked.  Performed at [facility], [address], [place], [place] [address]   Urine Culture     Status: None   Collection Time: 11/13/22  7:30 AM   Specimen: Urine, Clean Catch  Result Value Ref Range Status   Specimen Description   Final    URINE, CLEAN CATCH Performed at [facility], [address], [place], [place] [address]    Special Requests   Final    NONE Performed at [facility], [address] [place], [place] [address]    Culture   Final    NO GROWTH Performed at [facility], [address], [place], [place] [address]    Report Status 11/14/2022 FINAL  Final         Radiology Studies: No results found.      Scheduled Meds:  acetaminophen  500 mg Oral QHS   aspirin  81 mg Oral Daily   cholecalciferol  1,000 Units Oral Daily   diclofenac Sodium  2 g Topical QID   docusate sodium  100 mg Oral BID   enoxaparin (LOVENOX) injection  30 mg Subcutaneous Q24H   ferrous sulfate  325 mg Oral Daily   influenza vaccine adjuvanted  0.5 mL Intramuscular Tomorrow-1000   levothyroxine  75 mcg Oral Q0600   OLANZapine zydis  2.5 mg Oral QHS   pantoprazole  40 mg Oral Daily   venlafaxine XR  150 mg Oral Daily   venlafaxine XR  75 mg Oral Daily   Continuous Infusions:     LOS: 5 days    Time spent: 35 minutes    Ramiro Harvestaniel Anothony Bursch, MD [facility]   To contact the attending provider between 7A-7P or the covering provider during after hours 7P-7A, please log into the web site [email or web address] and access using universal [place] password for that web site. If you do not have the password, please call the hospital operator.  11/17/2022, 12:42 PM

## 2022-11-17 NOTE — TOC Progression Note (Signed)
Transition of Care [facility]) - Progression Note    Patient Details  Name: Ann Green MRN: [id number] Date of Birth: [birth year]  Transition of Care [facility]) CM/SW Contact  Allena Katz, LCSW Phone Number: 11/17/2022, 10:23 AM  Clinical Narrative:   SW spoke with pt insurance regarding appeal, [phone]. Insurance states appeal is still pending. Per insurance, appeal was filed at 6:14pm on 12/22 and that we should know that status of the appeal within 72 hours of this time. TOC will continue to follow.          Expected Discharge Plan and Services         Expected Discharge Date: 11/15/22                                     Social Determinants of Health (SDOH) Interventions SDOH Screenings   Food Insecurity: No Food Insecurity (11/10/2022)  Housing: Low Risk  (11/10/2022)  Transportation Needs: No Transportation Needs (11/10/2022)  Utilities: Not At Risk (11/10/2022)  Tobacco Use: Low Risk  (11/10/2022)    Readmission Risk Interventions     No data to display

## 2022-11-17 NOTE — Progress Notes (Signed)
Physical Therapy Treatment Patient Details Name: Ann Green MRN: [id number] DOB: 1922/09/28 Today's Date: 11/17/2022   History of Present Illness 88 y/o female presented to ED on 11/10/22 for AMS and UTI. Admitted for possible acute cystitis and L LE cellulitis. PMH: dementia, HTN, HLD    PT Comments    Pt in bed.  Daughter stated sleeping pill last night was given.  She is able to get to EOB with min/mod a x 1.  Sleeps in recliner at home.  She is able to stand with mod a x 1 and is inc of urine on floor during very poor quality transfer to [facility].  Pt with post lean and feet sliding with little to no bending at hips to transition to sitting.  Max assist needed to get pt safely to sitting.  She is cleaned for urine and +2 is called to assist with transfer back to bed.    Recommend +2 for mobility today with tech.  Unsure if mobility decline is due to meds or general decline.  Daughter in room stating that they have been having increased difficulty transferring at home and she is unable to care for pt at current level  and rehab is indicated at this time with plan to transition back home.   Recommendations for follow up therapy are one component of a multi-disciplinary discharge planning process, led by the attending physician.  Recommendations may be updated based on patient status, additional functional criteria and insurance authorization.  Follow Up Recommendations  Skilled nursing-short term rehab (<3 hours/day)     Assistance Recommended at Discharge Frequent or constant Supervision/Assistance  Patient can return home with the following Direct supervision/assist for financial management;Help with stairs or ramp for entrance;Direct supervision/assist for medications management;Assistance with cooking/housework;Assist for transportation;Two people to help with walking and/or transfers;A lot of help with bathing/dressing/bathroom   Equipment Recommendations       Recommendations for  Other Services       Precautions / Restrictions Precautions Precautions: Fall Restrictions Weight Bearing Restrictions: No     Mobility  Bed Mobility Overal bed mobility: Needs Assistance Bed Mobility: Supine to Sit, Sit to Supine     Supine to sit: Min assist, Mod assist Sit to supine: Min guard        Transfers Overall transfer level: Needs assistance Equipment used: Rolling walker (2 wheels) Transfers: Sit to/from Stand Sit to Stand: Mod assist     Squat pivot transfers: Mod assist          Ambulation/Gait Ambulation/Gait assistance: Mod assist, Max assist Gait Distance (Feet): 2 Feet Assistive device: Rolling walker (2 wheels) Gait Pattern/deviations: Step-to pattern, Decreased stride length, Knee flexed in stance - left, Knee flexed in stance - right, Narrow base of support Gait velocity: decreased     General Gait Details: significant decrease in transfer quality and gait to commode today.  high risk for falls   Stairs             Wheelchair Mobility    Modified Rankin (Stroke Patients Only)       Balance Overall balance assessment: History of Falls, Needs assistance Sitting-balance support: Feet supported Sitting balance-Leahy Scale: Fair     Standing balance support: Bilateral upper extremity supported, Reliant on assistive device for balance Standing balance-Leahy Scale: Poor Standing balance comment: significant decrease in quality of mobility today  Cognition Arousal/Alertness: Awake/alert Behavior During Therapy: WFL for tasks assessed/performed Overall Cognitive Status: History of cognitive impairments - at baseline                                          Exercises      General Comments        Pertinent Vitals/Pain Pain Assessment Pain Assessment: No/denies pain    Home Living                          Prior Function            PT Goals  (current goals can now be found in the care plan section) Progress towards PT goals: Not progressing toward goals - comment    Frequency    Min 2X/week      PT Plan Current plan remains appropriate    Co-evaluation              AM-PAC PT "6 Clicks" Mobility   Outcome Measure  Help needed turning from your back to your side while in a flat bed without using bedrails?: A Lot Help needed moving from lying on your back to sitting on the side of a flat bed without using bedrails?: A Lot Help needed moving to and from a bed to a chair (including a wheelchair)?: A Lot Help needed standing up from a chair using your arms (e.g., wheelchair or bedside chair)?: A Lot Help needed to walk in hospital room?: Total Help needed climbing 3-5 steps with a railing? : Total 6 Click Score: 10    End of Session Equipment Utilized During Treatment: Gait belt Activity Tolerance: Patient tolerated treatment well Patient left: in bed;with call bell/phone within reach;with family/visitor present Nurse Communication: Mobility status PT Visit Diagnosis: Unsteadiness on feet (R26.81);History of falling (Z91.81);Repeated falls (R29.6);Muscle weakness (generalized) (M62.81)     Time: 1040-1103 PT Time Calculation (min) (ACUTE ONLY): 23 min  Charges:  $Therapeutic Activity: 23-37 mins                   Danielle Dess, PTA 11/17/22, 11:12 AM

## 2022-11-18 DIAGNOSIS — R531 Weakness: Secondary | ICD-10-CM | POA: Diagnosis not present

## 2022-11-18 DIAGNOSIS — G9341 Metabolic encephalopathy: Secondary | ICD-10-CM | POA: Diagnosis not present

## 2022-11-18 DIAGNOSIS — N390 Urinary tract infection, site not specified: Secondary | ICD-10-CM | POA: Diagnosis not present

## 2022-11-18 DIAGNOSIS — E079 Disorder of thyroid, unspecified: Secondary | ICD-10-CM | POA: Diagnosis not present

## 2022-11-18 LAB — BASIC METABOLIC PANEL
Anion gap: 6 (ref 5–15)
BUN: 44 mg/dL — ABNORMAL HIGH (ref 8–23)
CO2: 24 mmol/L (ref 22–32)
Calcium: 8.9 mg/dL (ref 8.9–10.3)
Chloride: 108 mmol/L (ref 98–111)
Creatinine, Ser: 1.25 mg/dL — ABNORMAL HIGH (ref 0.44–1.00)
GFR, Estimated: 39 mL/min — ABNORMAL LOW (ref >60)
Glucose, Bld: 119 mg/dL — ABNORMAL HIGH (ref 70–99)
Potassium: 4.3 mmol/L (ref 3.5–5.1)
Sodium: 138 mmol/L (ref 135–145)

## 2022-11-18 MED ORDER — SODIUM CHLORIDE 0.9 % IV SOLN: INTRAVENOUS | Status: AC

## 2022-11-18 MED ORDER — DICLOFENAC SODIUM 1 % EX GEL
2.0000 g | Freq: Four times a day (QID) | CUTANEOUS | Status: DC | PRN
  Filled 2022-11-18: qty 100

## 2022-11-18 NOTE — Progress Notes (Signed)
PROGRESS NOTE    Ann Green  JYN:[id number] DOB: [birth year] DOA: 11/10/2022 PCP: Tawnya Crook, MD   No chief complaint on file.   Brief Narrative:  Ann Green is a 88 y.o. female with dementia, hypertension, hyperlipidemia presented to the hospital with generalized weakness, fatigue and tiredness with episodes of confusion and not acting herself.  Patient also had slurred speech and some weakness in the left arm so she was brought to the hospital.  By the time she came to the hospital, her weakness and slurred speech had improved. In the ED, patient had elevated blood pressure with some bradycardia.  Does have a history of bradycardia at baseline.  Labs in the ED, showed hemoglobin of 9.8,  no leukocytosis.  Creatinine was 0.8.  UA showed some white cells from 11/10/2022, done from outside.  Patient was started on Rocephin IV and was considered for admission to the hospital for generalized weakness, confusion, UTI.   At this time, patient has been seen by physical therapy who recommended skilled nursing facility placement.  Medically stable for disposition.   Assessment & Plan:   Principal Problem:   UTI (urinary tract infection) Active Problems:   Thyroid disease   Essential hypertension   Hyperlipidemia   Acute metabolic encephalopathy   Generalized weakness   Weakness  #1 generalized weakness/fatigue -Likely secondary to possible UTI, dehydration. -Patient noted to have been taking care of by caretaker at home but assessment by PT recommending SNF placement at this time. -TSH, vitamin B12 low normal range.  Vitamin D levels at 53. -TOC consulted for SNF placement. -Insurance declined SNF placement, I engaged in the peer to peer with MD at insurance company on 11/16/2022 and SNF placement declined again at that time. -Family have appealed which is in process.  2.  Chest wall pain -Patient noted to have some chest wall pain which was reproducible on palpation, improving  clinically. -EKG done with normal sinus rhythm with no ischemic changes. -Clinical improvement. -Change diclofenac gel to as needed.  3.  Possible acute cystitis -Patient noted to have been prescribed Macrobid in the outpatient setting prior to admission. -Urine cultures done during this hospitalization with no growth. -Status post 3 days IV Rocephin. -No further antibiotics needed at this time.  4.  Acute metabolic encephalopathy/mild confusion -Felt likely secondary to infectious etiology of UTI in the setting of underlying dementia. -Status post 3 days IV Rocephin. -Patient currently afebrile. -Clinical improvement.   -Likely at baseline.    5.  Concern for TIA with some focal weakness -Patient already noted to be on aspirin prior to admission. -Head CT done with no acute abnormalities. -Per Dr. Tyson Babinski, family did not wish to pursue any further workup including MRI as patient had improved and imaging would not likely change current management. -Continue aspirin on discharge for secondary stroke prophylaxis.  6.  Hypothyroidism -Synthroid.    7.  History of dementia -Per family patient noted with some bouts of confusion and some vivid dreams 2 nights ago.. -Being cared by caregivers. -Continue Effexor. -Patient placed on Zyprexa 2.5 mg nightly and Tylenol 500 mg nightly last night 11/16/2022 however per daughter patient slept well, confusion improved however patient sleeping most of the morning 11/17/2022 and as such Zyprexa discontinued and patient just maintained on Tylenol which she seems to be tolerating. -Supportive care.   8.  Dehydration -Patient with slight bump in BUN/creatinine. -Per family patient noted with some poor oral intake. -Place back on gentle hydration  for the next 24 hours.    DVT prophylaxis: Lovenox Code Status: DNR Family Communication: Updated family, granddaughter at bedside. Disposition: Insurance declined SNF, appeal process pending.    Status is: Inpatient Remains inpatient appropriate because: Unsafe disposition.  Appeal process pending.   Consultants:  None  Procedures: CT head 11/10/2022   Antimicrobials:  IV Rocephin 11/10/2022>>>> 11/13/2022   Subjective: Sitting up in bed.  More alert today.  Less drowsy than she was yesterday.  States chest wall tenderness has improved.  Denies any shortness of breath.  Granddaughter at bedside.    Objective: Vitals:   11/17/22 0811 11/17/22 1554 11/17/22 1955 11/18/22 0817  BP: 121/71 (!) 136/52 (!) 135/51 (!) 120/48  Pulse: (!) 54 64 71 64  Resp: 16 20 18 18   Temp: 98.7 F (37.1 C) 98.6 F (37 C) (!) 97.4 F (36.3 C) 98.2 F (36.8 C)  TempSrc: Oral  Oral   SpO2: 97% 100% 98% 99%  Weight:      Height:        Intake/Output Summary (Last 24 hours) at 11/18/2022 1300 Last data filed at 11/18/2022 1045 Gross per 24 hour  Intake 120 ml  Output 400 ml  Net -280 ml    Filed Weights   11/15/22 0500 11/16/22 0500 11/17/22 0454  Weight: 65 kg 65.2 kg 64 kg    Examination:  General exam: NAD Respiratory system: Lungs clear to auscultation bilaterally.  No wheezes, no crackles, no rhonchi.  Fair air movement.  Speaking in full sentences.  Cardiovascular system: Regular rate rhythm no murmurs rubs or gallops.  No JVD.  No lower extremity edema.  Decreased chest wall tenderness to palpation.  Gastrointestinal system: Abdomen is soft, nontender, nondistended, positive bowel sounds.  No rebound.  No guarding.  Central nervous system: Alert and oriented. No focal neurological deficits. Extremities: Symmetric 5 x 5 power. Skin: No rashes, lesions or ulcers Psychiatry: Judgement and insight appear normal. Mood & affect appropriate.     Data Reviewed: I have personally reviewed following labs and imaging studies  CBC: Recent Labs  Lab 11/14/22 0502  WBC 7.4  HGB 10.1*  HCT 33.2*  MCV 86.9  PLT 202     Basic Metabolic Panel: Recent Labs  Lab  11/14/22 0502 11/15/22 0422 11/18/22 0759  NA 142 141 138  K 4.5 4.3 4.3  CL 110 110 108  CO2 27 27 24   GLUCOSE 92 90 119*  BUN 29* 33* 44*  CREATININE 1.19* 1.03* 1.25*  CALCIUM 8.5* 8.5* 8.9     GFR: Estimated Creatinine Clearance: 21 mL/min (A) (by C-G formula based on SCr of 1.25 mg/dL (H)).  Liver Function Tests: No results for input(s): "AST", "ALT", "ALKPHOS", "BILITOT", "PROT", "ALBUMIN" in the last 168 hours.   CBG: No results for input(s): "GLUCAP" in the last 168 hours.   Recent Results (from the past 240 hour(s))  Resp panel by RT-PCR (RSV, Flu A&B, Covid) Anterior Nasal Swab     Status: None   Collection Time: 11/10/22  4:44 PM   Specimen: Anterior Nasal Swab  Result Value Ref Range Status   SARS Coronavirus 2 by RT PCR NEGATIVE NEGATIVE Final    Comment: (NOTE) SARS-CoV-2 target nucleic acids are NOT DETECTED.  The SARS-CoV-2 RNA is generally detectable in upper respiratory specimens during the acute phase of infection. The lowest concentration of SARS-CoV-2 viral copies this assay can detect is 138 copies/mL. A negative result does not preclude SARS-Cov-2 infection and should not be  used as the sole basis for treatment or other patient management decisions. A negative result may occur with  improper specimen collection/handling, submission of specimen other than nasopharyngeal swab, presence of viral mutation(s) within the areas targeted by this assay, and inadequate number of viral copies(<138 copies/mL). A negative result must be combined with clinical observations, patient history, and epidemiological information. The expected result is Negative.  Fact Sheet for Patients:  BloggerCourse.com  Fact Sheet for Healthcare Providers:  SeriousBroker.it  This test is no t yet approved or cleared by the [place] FDA and  has been authorized for detection and/or diagnosis of SARS-CoV-2 by FDA under  an Emergency Use Authorization (EUA). This EUA will remain  in effect (meaning this test can be used) for the duration of the COVID-19 declaration under Section 564(b)(1) of the Act, 21 U.S.C.section 360bbb-3(b)(1), unless the authorization is terminated  or revoked sooner.       Influenza A by PCR NEGATIVE NEGATIVE Final   Influenza B by PCR NEGATIVE NEGATIVE Final    Comment: (NOTE) The Xpert Xpress SARS-CoV-2/FLU/RSV plus assay is intended as an aid in the diagnosis of influenza from Nasopharyngeal swab specimens and should not be used as a sole basis for treatment. Nasal washings and aspirates are unacceptable for Xpert Xpress SARS-CoV-2/FLU/RSV testing.  Fact Sheet for Patients: BloggerCourse.com  Fact Sheet for Healthcare Providers: SeriousBroker.it  This test is not yet approved or cleared by the [place] FDA and has been authorized for detection and/or diagnosis of SARS-CoV-2 by FDA under an Emergency Use Authorization (EUA). This EUA will remain in effect (meaning this test can be used) for the duration of the COVID-19 declaration under Section 564(b)(1) of the Act, 21 U.S.C. section 360bbb-3(b)(1), unless the authorization is terminated or revoked.     Resp Syncytial Virus by PCR NEGATIVE NEGATIVE Final    Comment: (NOTE) Fact Sheet for Patients: BloggerCourse.com  Fact Sheet for Healthcare Providers: SeriousBroker.it  This test is not yet approved or cleared by the [place] FDA and has been authorized for detection and/or diagnosis of SARS-CoV-2 by FDA under an Emergency Use Authorization (EUA). This EUA will remain in effect (meaning this test can be used) for the duration of the COVID-19 declaration under Section 564(b)(1) of the Act, 21 U.S.C. section 360bbb-3(b)(1), unless the authorization is terminated or revoked.  Performed at [facility], [address], [place], [place] [address]   Urine Culture     Status: None   Collection Time: 11/13/22  7:30 AM   Specimen: Urine, Clean Catch  Result Value Ref Range Status   Specimen Description   Final    URINE, CLEAN CATCH Performed at [facility], [address] [place], [place] [address]    Special Requests   Final    NONE Performed at [facility], [address], [place], [place] [address]    Culture   Final    NO GROWTH Performed at [facility], [address] [place], [place] [address]    Report Status 11/14/2022 FINAL  Final         Radiology Studies: No results found.      Scheduled Meds:  acetaminophen  325 mg Oral QHS   aspirin  81 mg Oral Daily   cholecalciferol  1,000 Units Oral Daily   diclofenac Sodium  2 g Topical QID   docusate sodium  100 mg Oral BID   enoxaparin (LOVENOX) injection  30 mg Subcutaneous Q24H  ferrous sulfate  325 mg Oral Daily   influenza vaccine adjuvanted  0.5 mL Intramuscular Tomorrow-1000   levothyroxine  75 mcg Oral Q0600   pantoprazole  40 mg Oral Daily   venlafaxine XR  150 mg Oral Daily   venlafaxine XR  75 mg Oral Daily   Continuous Infusions:  sodium chloride        LOS: 6 days    Time spent: 35 minutes    Irine Seal, MD [facility]   To contact the attending provider between 7A-7P or the covering provider during after hours 7P-7A, please log into the web site [email or web address] and access using universal [place] password for that web site. If you do not have the password, please call the hospital operator.  11/18/2022, 1:00 PM

## 2022-11-19 DIAGNOSIS — N390 Urinary tract infection, site not specified: Secondary | ICD-10-CM | POA: Diagnosis not present

## 2022-11-19 DIAGNOSIS — G9341 Metabolic encephalopathy: Secondary | ICD-10-CM | POA: Diagnosis not present

## 2022-11-19 DIAGNOSIS — E079 Disorder of thyroid, unspecified: Secondary | ICD-10-CM | POA: Diagnosis not present

## 2022-11-19 DIAGNOSIS — R531 Weakness: Secondary | ICD-10-CM | POA: Diagnosis not present

## 2022-11-19 LAB — BASIC METABOLIC PANEL
Anion gap: 6 (ref 5–15)
BUN: 36 mg/dL — ABNORMAL HIGH (ref 8–23)
CO2: 23 mmol/L (ref 22–32)
Calcium: 8.4 mg/dL — ABNORMAL LOW (ref 8.9–10.3)
Chloride: 111 mmol/L (ref 98–111)
Creatinine, Ser: 0.99 mg/dL (ref 0.44–1.00)
GFR, Estimated: 52 mL/min — ABNORMAL LOW (ref >60)
Glucose, Bld: 111 mg/dL — ABNORMAL HIGH (ref 70–99)
Potassium: 4.2 mmol/L (ref 3.5–5.1)
Sodium: 140 mmol/L (ref 135–145)

## 2022-11-19 LAB — RESP PANEL BY RT-PCR (RSV, FLU A&B, COVID)  RVPGX2
Influenza A by PCR: NEGATIVE
Influenza B by PCR: NEGATIVE
Resp Syncytial Virus by PCR: NEGATIVE
SARS Coronavirus 2 by RT PCR: NEGATIVE

## 2022-11-19 MED ORDER — ACETAMINOPHEN 500 MG PO TABS
500.0000 mg | ORAL_TABLET | Freq: Every day | ORAL | Status: DC
  Administered 2022-11-19 – 2022-11-21 (×2): 500 mg via ORAL
  Filled 2022-11-19 (×2): qty 1

## 2022-11-19 MED ORDER — LORATADINE 10 MG PO TABS
10.0000 mg | ORAL_TABLET | Freq: Every day | ORAL | Status: DC
  Administered 2022-11-19 – 2022-11-27 (×9): 10 mg via ORAL
  Filled 2022-11-19 (×9): qty 1

## 2022-11-19 MED ORDER — FLUTICASONE PROPIONATE 50 MCG/ACT NA SUSP
1.0000 | Freq: Every day | NASAL | Status: DC
  Administered 2022-11-19 – 2022-11-27 (×9): 1 via NASAL
  Filled 2022-11-19: qty 16

## 2022-11-19 MED ORDER — MIRTAZAPINE 15 MG PO TBDP
7.5000 mg | ORAL_TABLET | Freq: Every evening | ORAL | Status: DC | PRN
  Administered 2022-11-19 – 2022-11-26 (×4): 7.5 mg via ORAL
  Filled 2022-11-19 (×5): qty 0.5

## 2022-11-19 MED ORDER — BENZONATATE 100 MG PO CAPS: 100.0000 mg | ORAL_CAPSULE | Freq: Three times a day (TID) | ORAL | Status: DC | PRN

## 2022-11-19 NOTE — Progress Notes (Signed)
PROGRESS NOTE    Ann Green  ZOX:[id number] DOB: [birth year] DOA: 11/10/2022 PCP: Tawnya Crookoche, [place], MD   No chief complaint on file.   Brief Narrative:  Ann LikeVera C Green is a 88 y.o. female with dementia, hypertension, hyperlipidemia presented to the hospital with generalized weakness, fatigue and tiredness with episodes of confusion and not acting herself.  Patient also had slurred speech and some weakness in the left arm so she was brought to the hospital.  By the time she came to the hospital, her weakness and slurred speech had improved. In the ED, patient had elevated blood pressure with some bradycardia.  Does have a history of bradycardia at baseline.  Labs in the ED, showed hemoglobin of 9.8,  no leukocytosis.  Creatinine was 0.8.  UA showed some white cells from 11/10/2022, done from outside.  Patient was started on Rocephin IV and was considered for admission to the hospital for generalized weakness, confusion, UTI.   At this time, patient has been seen by physical therapy who recommended skilled nursing facility placement.  Medically stable for disposition.   Assessment & Plan:   Principal Problem:   UTI (urinary tract infection) Active Problems:   Thyroid disease   Essential hypertension   Hyperlipidemia   Acute metabolic encephalopathy   Generalized weakness   Weakness  #1 generalized weakness/fatigue -Likely secondary to possible UTI, dehydration. -Patient noted to have been taking care of by caretaker at home but assessment by PT recommending SNF placement at this time. -TSH, vitamin B12 low normal range.  Vitamin D levels at 53. -TOC consulted for SNF placement. -Insurance declined SNF placement, I engaged in the peer to peer with MD at insurance company on 11/16/2022 and SNF placement declined again at that time. -Family have appealed which is in process.  2.  Chest wall pain -Patient noted to have some chest wall pain which was reproducible on palpation, improving  clinically. -EKG done with normal sinus rhythm with no ischemic changes. -Clinical improvement. -Voltaren gel as needed.   3.  Possible acute cystitis -Patient noted to have been prescribed Macrobid in the outpatient setting prior to admission. -Urine cultures done during this hospitalization with no growth. -Status post 3 days IV Rocephin. -No further antibiotics needed at this time.  4.  Acute metabolic encephalopathy/mild confusion -Felt likely secondary to infectious etiology of UTI in the setting of underlying dementia. -Status post 3 days IV Rocephin.   -Afebrile.   -Improved clinically.   -Likely at baseline.    5.  Concern for TIA with some focal weakness -Patient already noted to be on aspirin prior to admission. -Head CT done with no acute abnormalities. -Per Dr. Tyson BabinskiPokhrel, family did not wish to pursue any further workup including MRI as patient had improved and imaging would not likely change current management. -Continue aspirin on discharge for secondary stroke prophylaxis.  6.  Hypothyroidism -Synthroid.  7.  History of dementia -Per family patient noted with some bouts of confusion and some vivid dreams 2 nights ago.. -Being cared by caregivers. -Continue Effexor. -Patient placed on Zyprexa 2.5 mg nightly and Tylenol 500 mg on12/22/2023 however per daughter patient slept well, confusion improved however patient sleeping most of the morning 11/17/2022 and as such Zyprexa discontinued and patient just maintained on Tylenol which she seems to be tolerating. -Trial of Remeron as needed nightly. -Supportive care.   8.  Dehydration -Patient with slight bump in BUN/creatinine. -Per family patient noted with some poor oral intake. -Continue gentle hydration.  DVT prophylaxis: Lovenox Code Status: DNR Family Communication: Updated patient, granddaughter at bedside. Disposition: Insurance declined SNF, appeal process pending.   Status is: Inpatient Remains  inpatient appropriate because: Unsafe disposition.  Appeal process pending.   Consultants:  None  Procedures: CT head 11/10/2022   Antimicrobials:  IV Rocephin 11/10/2022>>>> 11/13/2022   Subjective: Sitting up in bed.  Patient with some cough and congestion per granddaughter at bedside.  Patient less drowsy than she was.  Per granddaughter patient had a long night, some bouts of agitation, difficulty sleeping.  Some poor oral intake as well.  Objective: Vitals:   11/18/22 1539 11/18/22 2134 11/19/22 0500 11/19/22 0629  BP: (!) 141/51 (!) 140/44  (!) 153/49  Pulse: 69 66  70  Resp: 20 16  18   Temp: 97.8 F (36.6 C) 97.9 F (36.6 C)  98.1 F (36.7 C)  TempSrc:      SpO2: 100% 100%  97%  Weight:   64.8 kg   Height:        Intake/Output Summary (Last 24 hours) at 11/19/2022 1125 Last data filed at 11/19/2022 11/21/2022 Gross per 24 hour  Intake 614.19 ml  Output 1000 ml  Net -385.81 ml    Filed Weights   11/16/22 0500 11/17/22 0454 11/19/22 0500  Weight: 65.2 kg 64 kg 64.8 kg    Examination:  General exam: NAD Respiratory system: CTAB.  No wheezes, no crackles, no rhonchi.  Fair air movement.  Speaking in full sentences.  Cardiovascular system: RRR no murmurs rubs or gallops.  No JVD.  No lower extremity edema.  Chest wall tenderness to palpation improved.  Gastrointestinal system: Abdomen is soft, nontender, nondistended, positive bowel sounds.  No rebound.  No guarding.  Central nervous system: Alert and oriented. No focal neurological deficits. Extremities: Symmetric 5 x 5 power. Skin: No rashes, lesions or ulcers Psychiatry: Judgement and insight appear normal. Mood & affect appropriate.     Data Reviewed: I have personally reviewed following labs and imaging studies  CBC: Recent Labs  Lab 11/14/22 0502  WBC 7.4  HGB 10.1*  HCT 33.2*  MCV 86.9  PLT 202     Basic Metabolic Panel: Recent Labs  Lab 11/14/22 0502 11/15/22 0422 11/18/22 0759  11/19/22 0905  NA 142 141 138 140  K 4.5 4.3 4.3 4.2  CL 110 110 108 111  CO2 27 27 24 23   GLUCOSE 92 90 119* 111*  BUN 29* 33* 44* 36*  CREATININE 1.19* 1.03* 1.25* 0.99  CALCIUM 8.5* 8.5* 8.9 8.4*     GFR: Estimated Creatinine Clearance: 26.6 mL/min (by C-G formula based on SCr of 0.99 mg/dL).  Liver Function Tests: No results for input(s): "AST", "ALT", "ALKPHOS", "BILITOT", "PROT", "ALBUMIN" in the last 168 hours.   CBG: No results for input(s): "GLUCAP" in the last 168 hours.   Recent Results (from the past 240 hour(s))  Resp panel by RT-PCR (RSV, Flu A&B, Covid) Anterior Nasal Swab     Status: None   Collection Time: 11/10/22  4:44 PM   Specimen: Anterior Nasal Swab  Result Value Ref Range Status   SARS Coronavirus 2 by RT PCR NEGATIVE NEGATIVE Final    Comment: (NOTE) SARS-CoV-2 target nucleic acids are NOT DETECTED.  The SARS-CoV-2 RNA is generally detectable in upper respiratory specimens during the acute phase of infection. The lowest concentration of SARS-CoV-2 viral copies this assay can detect is 138 copies/mL. A negative result does not preclude SARS-Cov-2 infection and should not be used  as the sole basis for treatment or other patient management decisions. A negative result may occur with  improper specimen collection/handling, submission of specimen other than nasopharyngeal swab, presence of viral mutation(s) within the areas targeted by this assay, and inadequate number of viral copies(<138 copies/mL). A negative result must be combined with clinical observations, patient history, and epidemiological information. The expected result is Negative.  Fact Sheet for Patients:  BloggerCourse.com  Fact Sheet for Healthcare Providers:  SeriousBroker.it  This test is no t yet approved or cleared by the [place] FDA and  has been authorized for detection and/or diagnosis of SARS-CoV-2 by FDA under an  Emergency Use Authorization (EUA). This EUA will remain  in effect (meaning this test can be used) for the duration of the COVID-19 declaration under Section 564(b)(1) of the Act, 21 U.S.C.section 360bbb-3(b)(1), unless the authorization is terminated  or revoked sooner.       Influenza A by PCR NEGATIVE NEGATIVE Final   Influenza B by PCR NEGATIVE NEGATIVE Final    Comment: (NOTE) The Xpert Xpress SARS-CoV-2/FLU/RSV plus assay is intended as an aid in the diagnosis of influenza from Nasopharyngeal swab specimens and should not be used as a sole basis for treatment. Nasal washings and aspirates are unacceptable for Xpert Xpress SARS-CoV-2/FLU/RSV testing.  Fact Sheet for Patients: BloggerCourse.com  Fact Sheet for Healthcare Providers: SeriousBroker.it  This test is not yet approved or cleared by the [place] FDA and has been authorized for detection and/or diagnosis of SARS-CoV-2 by FDA under an Emergency Use Authorization (EUA). This EUA will remain in effect (meaning this test can be used) for the duration of the COVID-19 declaration under Section 564(b)(1) of the Act, 21 U.S.C. section 360bbb-3(b)(1), unless the authorization is terminated or revoked.     Resp Syncytial Virus by PCR NEGATIVE NEGATIVE Final    Comment: (NOTE) Fact Sheet for Patients: BloggerCourse.com  Fact Sheet for Healthcare Providers: SeriousBroker.it  This test is not yet approved or cleared by the [place] FDA and has been authorized for detection and/or diagnosis of SARS-CoV-2 by FDA under an Emergency Use Authorization (EUA). This EUA will remain in effect (meaning this test can be used) for the duration of the COVID-19 declaration under Section 564(b)(1) of the Act, 21 U.S.C. section 360bbb-3(b)(1), unless the authorization is terminated or revoked.  Performed at [facility], [address], [place], [place] [address]   Urine Culture     Status: None   Collection Time: 11/13/22  7:30 AM   Specimen: Urine, Clean Catch  Result Value Ref Range Status   Specimen Description   Final    URINE, CLEAN CATCH Performed at [facility], [address], [place], [place] [address]    Special Requests   Final    NONE Performed at [facility], [address] [place], [place] [address]    Culture   Final    NO GROWTH Performed at [facility], [address], [place], [place] [address]    Report Status 11/14/2022 FINAL  Final         Radiology Studies: No results found.      Scheduled Meds:  acetaminophen  325 mg Oral QHS   aspirin  81 mg Oral Daily   cholecalciferol  1,000 Units Oral Daily   docusate sodium  100 mg Oral BID   enoxaparin (LOVENOX) injection  30 mg Subcutaneous Q24H   ferrous sulfate  325 mg Oral Daily  influenza vaccine adjuvanted  0.5 mL Intramuscular Tomorrow-1000   levothyroxine  75 mcg Oral Q0600   pantoprazole  40 mg Oral Daily   venlafaxine XR  150 mg Oral Daily   venlafaxine XR  75 mg Oral Daily   Continuous Infusions:  sodium chloride 50 mL/hr at 11/19/22 0806      LOS: 7 days    Time spent: 35 minutes    Ramiro Harvest, MD [facility]   To contact the attending provider between 7A-7P or the covering provider during after hours 7P-7A, please log into the web site [email or web address] and access using universal [place] password for that web site. If you do not have the password, please call the hospital operator.  11/19/2022, 11:25 AM

## 2022-11-20 DIAGNOSIS — R531 Weakness: Secondary | ICD-10-CM | POA: Diagnosis not present

## 2022-11-20 DIAGNOSIS — G9341 Metabolic encephalopathy: Secondary | ICD-10-CM | POA: Diagnosis not present

## 2022-11-20 DIAGNOSIS — E079 Disorder of thyroid, unspecified: Secondary | ICD-10-CM | POA: Diagnosis not present

## 2022-11-20 DIAGNOSIS — N390 Urinary tract infection, site not specified: Secondary | ICD-10-CM | POA: Diagnosis not present

## 2022-11-20 LAB — BASIC METABOLIC PANEL
Anion gap: 5 (ref 5–15)
BUN: 34 mg/dL — ABNORMAL HIGH (ref 8–23)
CO2: 22 mmol/L (ref 22–32)
Calcium: 8.4 mg/dL — ABNORMAL LOW (ref 8.9–10.3)
Chloride: 115 mmol/L — ABNORMAL HIGH (ref 98–111)
Creatinine, Ser: 1.03 mg/dL — ABNORMAL HIGH (ref 0.44–1.00)
GFR, Estimated: 49 mL/min — ABNORMAL LOW (ref >60)
Glucose, Bld: 93 mg/dL (ref 70–99)
Potassium: 4.3 mmol/L (ref 3.5–5.1)
Sodium: 142 mmol/L (ref 135–145)

## 2022-11-20 NOTE — Progress Notes (Signed)
PROGRESS NOTE    Ann Green  [id number] DOB: 02/06/1922 DOA: 11/10/2022 PCP: Ria Clock, MD   No chief complaint on file.   Brief Narrative:  Ann Green is a 88 y.o. female with dementia, hypertension, hyperlipidemia presented to the hospital with generalized weakness, fatigue and tiredness with episodes of confusion and not acting herself.  Patient also had slurred speech and some weakness in the left arm so she was brought to the hospital.  By the time she came to the hospital, her weakness and slurred speech had improved. In the ED, patient had elevated blood pressure with some bradycardia.  Does have a history of bradycardia at baseline.  Labs in the ED, showed hemoglobin of 9.8,  no leukocytosis.  Creatinine was 0.8.  UA showed some white cells from 11/10/2022, done from outside.  Patient was started on Rocephin IV and was considered for admission to the hospital for generalized weakness, confusion, UTI.   At this time, patient has been seen by physical therapy who recommended skilled nursing facility placement.  Medically stable for disposition.   Assessment & Plan:   Principal Problem:   UTI (urinary tract infection) Active Problems:   Thyroid disease   Essential hypertension   Hyperlipidemia   Acute metabolic encephalopathy   Generalized weakness   Weakness  #1 generalized weakness/fatigue -Likely secondary to possible UTI, dehydration. -Patient noted to have been taking care of by caretaker at home but assessment by PT recommending SNF placement at this time. -TSH, vitamin B12 low normal range.  Vitamin D levels at 53. -TOC consulted for SNF placement. -Insurance declined SNF placement, I engaged in the peer to peer with MD at insurance company on 11/16/2022 and SNF placement declined again at that time. -Family have appealed which is in process.  2.  Chest wall pain -Patient noted to have some chest wall pain which was reproducible on palpation, improving  clinically. -EKG done with normal sinus rhythm with no ischemic changes. -Clinical improvement on Voltaren gel. -Continue Voltaren gel as needed.  3.  Possible acute cystitis -Patient noted to have been prescribed Macrobid in the outpatient setting prior to admission. -Urine cultures done during this hospitalization with no growth. -Status post 3 days IV Rocephin. -No further antibiotics needed at this time.  4.  Acute metabolic encephalopathy/mild confusion -Felt likely secondary to infectious etiology of UTI in the setting of underlying dementia. -Status post 3 days IV Rocephin.   -Clinical improvement.  Likely at baseline.    5.  Concern for TIA with some focal weakness -Patient already noted to be on aspirin prior to admission. -Head CT done with no acute abnormalities. -Per Dr. Louanne Belton, family did not wish to pursue any further workup including MRI as patient had improved and imaging would not likely change current management. -Continue aspirin on discharge for secondary stroke prophylaxis.  6.  Hypothyroidism -Continue Synthroid.   7.  History of dementia -Per family patient noted with some bouts of confusion and some vivid dreams 2 nights ago.. -Being cared by caregivers. -Continue Effexor. -Patient placed on Zyprexa 2.5 mg nightly and Tylenol 500 mg on12/22/2023 however per daughter patient slept well, confusion improved however patient sleeping most of the morning 11/17/2022 and as such Zyprexa discontinued and patient just maintained on Tylenol which she seems to be tolerating. -Patient seems to have tolerated Remeron overnight as needed.   -Supportive care.   8.  Dehydration -Patient with slight bump in BUN/creatinine. -Per family patient noted with some  poor oral intake. -Status post gentle hydration, discontinue IV fluids.    DVT prophylaxis: Lovenox Code Status: DNR Family Communication: Updated patient, granddaughter at bedside. Disposition: Insurance declined  SNF, appeal process pending.  Medically stable.  Status is: Inpatient Remains inpatient appropriate because: Unsafe disposition.  Appeal process pending.   Consultants:  None  Procedures: CT head 11/10/2022   Antimicrobials:  IV Rocephin 11/10/2022>>>> 11/13/2022   Subjective: Laying in bed.  Granddaughter at bedside stated patient slept better, less agitated overnight with Remeron.  Per granddaughter patient less drowsy with Remeron than Zyprexa.  No chest pain.  No shortness of breath.    Objective: Vitals:   11/19/22 1746 11/19/22 2000 11/20/22 0357 11/20/22 0433  BP: (!) 141/56 (!) 134/45  (!) 139/49  Pulse: 67 70  60  Resp: 18 19  19   Temp: 98.2 F (36.8 C) 97.6 F (36.4 C)  97.7 F (36.5 C)  TempSrc:    Oral  SpO2: 100% 97%  100%  Weight:   64.2 kg   Height:        Intake/Output Summary (Last 24 hours) at 11/20/2022 1316 Last data filed at 11/20/2022 0516 Gross per 24 hour  Intake 1031.73 ml  Output 400 ml  Net 631.73 ml    Filed Weights   11/17/22 0454 11/19/22 0500 11/20/22 0357  Weight: 64 kg 64.8 kg 64.2 kg    Examination:  General exam: NAD Respiratory system: Lungs clear to auscultation bilaterally.  No wheezes, no crackles, no rhonchi.  Fair air movement.  Speaking in full sentences.  No use of accessory muscles of respiration.  Cardiovascular system: Regular rate and rhythm no murmurs rubs or gallops.  No JVD.  No lower extremity edema.  Improved chest wall tenderness to palpation. Gastrointestinal system: Abdomen is soft, nontender, nondistended, positive bowel sounds.  No rebound.  No guarding.  Central nervous system: Alert and oriented.  Moving extremities spontaneously.  No focal neurological deficits.  Extremities: Symmetric 5 x 5 power. Skin: No rashes, lesions or ulcers Psychiatry: Judgement and insight appear normal. Mood & affect appropriate.     Data Reviewed: I have personally reviewed following labs and imaging  studies  CBC: Recent Labs  Lab 11/14/22 0502  WBC 7.4  HGB 10.1*  HCT 33.2*  MCV 86.9  PLT 202     Basic Metabolic Panel: Recent Labs  Lab 11/14/22 0502 11/15/22 0422 11/18/22 0759 11/19/22 0905 11/20/22 0811  NA 142 141 138 140 142  K 4.5 4.3 4.3 4.2 4.3  CL 110 110 108 111 115*  CO2 27 27 24 23 22   GLUCOSE 92 90 119* 111* 93  BUN 29* 33* 44* 36* 34*  CREATININE 1.19* 1.03* 1.25* 0.99 1.03*  CALCIUM 8.5* 8.5* 8.9 8.4* 8.4*     GFR: Estimated Creatinine Clearance: 25.5 mL/min (A) (by C-G formula based on SCr of 1.03 mg/dL (H)).  Liver Function Tests: No results for input(s): "AST", "ALT", "ALKPHOS", "BILITOT", "PROT", "ALBUMIN" in the last 168 hours.   CBG: No results for input(s): "GLUCAP" in the last 168 hours.   Recent Results (from the past 240 hour(s))  Resp panel by RT-PCR (RSV, Flu A&B, Covid) Anterior Nasal Swab     Status: None   Collection Time: 11/10/22  4:44 PM   Specimen: Anterior Nasal Swab  Result Value Ref Range Status   SARS Coronavirus 2 by RT PCR NEGATIVE NEGATIVE Final    Comment: (NOTE) SARS-CoV-2 target nucleic acids are NOT DETECTED.  The SARS-CoV-2  RNA is generally detectable in upper respiratory specimens during the acute phase of infection. The lowest concentration of SARS-CoV-2 viral copies this assay can detect is 138 copies/mL. A negative result does not preclude SARS-Cov-2 infection and should not be used as the sole basis for treatment or other patient management decisions. A negative result may occur with  improper specimen collection/handling, submission of specimen other than nasopharyngeal swab, presence of viral mutation(s) within the areas targeted by this assay, and inadequate number of viral copies(<138 copies/mL). A negative result must be combined with clinical observations, patient history, and epidemiological information. The expected result is Negative.  Fact Sheet for Patients:   EntrepreneurPulse.com.au  Fact Sheet for Healthcare Providers:  IncredibleEmployment.be  This test is no t yet approved or cleared by the [place] FDA and  has been authorized for detection and/or diagnosis of SARS-CoV-2 by FDA under an Emergency Use Authorization (EUA). This EUA will remain  in effect (meaning this test can be used) for the duration of the COVID-19 declaration under Section 564(b)(1) of the Act, 21 U.S.C.section 360bbb-3(b)(1), unless the authorization is terminated  or revoked sooner.       Influenza A by PCR NEGATIVE NEGATIVE Final   Influenza B by PCR NEGATIVE NEGATIVE Final    Comment: (NOTE) The Xpert Xpress SARS-CoV-2/FLU/RSV plus assay is intended as an aid in the diagnosis of influenza from Nasopharyngeal swab specimens and should not be used as a sole basis for treatment. Nasal washings and aspirates are unacceptable for Xpert Xpress SARS-CoV-2/FLU/RSV testing.  Fact Sheet for Patients: EntrepreneurPulse.com.au  Fact Sheet for Healthcare Providers: IncredibleEmployment.be  This test is not yet approved or cleared by the [place] FDA and has been authorized for detection and/or diagnosis of SARS-CoV-2 by FDA under an Emergency Use Authorization (EUA). This EUA will remain in effect (meaning this test can be used) for the duration of the COVID-19 declaration under Section 564(b)(1) of the Act, 21 U.S.C. section 360bbb-3(b)(1), unless the authorization is terminated or revoked.     Resp Syncytial Virus by PCR NEGATIVE NEGATIVE Final    Comment: (NOTE) Fact Sheet for Patients: EntrepreneurPulse.com.au  Fact Sheet for Healthcare Providers: IncredibleEmployment.be  This test is not yet approved or cleared by the [place] FDA and has been authorized for detection and/or diagnosis of SARS-CoV-2 by FDA under an Emergency Use  Authorization (EUA). This EUA will remain in effect (meaning this test can be used) for the duration of the COVID-19 declaration under Section 564(b)(1) of the Act, 21 U.S.C. section 360bbb-3(b)(1), unless the authorization is terminated or revoked.  Performed at [facility], [address] [place], [place] [address]   Urine Culture     Status: None   Collection Time: 11/13/22  7:30 AM   Specimen: Urine, Clean Catch  Result Value Ref Range Status   Specimen Description   Final    URINE, CLEAN CATCH Performed at [facility], [address] [place], [place] [address]    Special Requests   Final    NONE Performed at [facility], [address], [place], [place] [address]    Culture   Final    NO GROWTH Performed at [facility], [place] [address], [place], [place] [address]    Report Status 11/14/2022 FINAL  Final  Resp panel by RT-PCR (RSV, Flu A&B, Covid) Anterior Nasal Swab     Status: None   Collection Time: 11/19/22 12:12 PM   Specimen: Anterior Nasal Swab  Result Value Ref Range Status   SARS Coronavirus 2 by RT PCR NEGATIVE NEGATIVE Final    Comment: (NOTE) SARS-CoV-2 target nucleic acids are NOT DETECTED.  The SARS-CoV-2 RNA is generally detectable in upper respiratory specimens during the acute phase of infection. The lowest concentration of SARS-CoV-2 viral copies this assay can detect is 138 copies/mL. A negative result does not preclude SARS-Cov-2 infection and should not be used as the sole basis for treatment or other patient management decisions. A negative result may occur with  improper specimen collection/handling, submission of specimen other than nasopharyngeal swab, presence of viral mutation(s) within the areas targeted by this assay, and inadequate number of viral copies(<138 copies/mL). A negative result must be combined with clinical observations, patient history, and epidemiological information. The expected  result is Negative.  Fact Sheet for Patients:  BloggerCourse.com  Fact Sheet for Healthcare Providers:  SeriousBroker.it  This test is no t yet approved or cleared by the [place] FDA and  has been authorized for detection and/or diagnosis of SARS-CoV-2 by FDA under an Emergency Use Authorization (EUA). This EUA will remain  in effect (meaning this test can be used) for the duration of the COVID-19 declaration under Section 564(b)(1) of the Act, 21 U.S.C.section 360bbb-3(b)(1), unless the authorization is terminated  or revoked sooner.       Influenza A by PCR NEGATIVE NEGATIVE Final   Influenza B by PCR NEGATIVE NEGATIVE Final    Comment: (NOTE) The Xpert Xpress SARS-CoV-2/FLU/RSV plus assay is intended as an aid in the diagnosis of influenza from Nasopharyngeal swab specimens and should not be used as a sole basis for treatment. Nasal washings and aspirates are unacceptable for Xpert Xpress SARS-CoV-2/FLU/RSV testing.  Fact Sheet for Patients: BloggerCourse.com  Fact Sheet for Healthcare Providers: SeriousBroker.it  This test is not yet approved or cleared by the [place] FDA and has been authorized for detection and/or diagnosis of SARS-CoV-2 by FDA under an Emergency Use Authorization (EUA). This EUA will remain in effect (meaning this test can be used) for the duration of the COVID-19 declaration under Section 564(b)(1) of the Act, 21 U.S.C. section 360bbb-3(b)(1), unless the authorization is terminated or revoked.     Resp Syncytial Virus by PCR NEGATIVE NEGATIVE Final    Comment: (NOTE) Fact Sheet for Patients: BloggerCourse.com  Fact Sheet for Healthcare Providers: SeriousBroker.it  This test is not yet approved or cleared by the [place] FDA and has been authorized for detection and/or diagnosis of  SARS-CoV-2 by FDA under an Emergency Use Authorization (EUA). This EUA will remain in effect (meaning this test can be used) for the duration of the COVID-19 declaration under Section 564(b)(1) of the Act, 21 U.S.C. section 360bbb-3(b)(1), unless the authorization is terminated or revoked.  Performed at [facility], [address], [place], [place] [address]          Radiology Studies: No results found.      Scheduled Meds:  acetaminophen  500 mg Oral QHS   aspirin  81 mg Oral Daily   cholecalciferol  1,000 Units Oral Daily   docusate sodium  100 mg Oral BID   enoxaparin (LOVENOX) injection  30 mg Subcutaneous Q24H   ferrous sulfate  325 mg Oral Daily   fluticasone  1 spray Each Nare Daily   influenza vaccine adjuvanted  0.5 mL Intramuscular Tomorrow-1000   levothyroxine  75 mcg Oral Q0600   loratadine  10 mg Oral Daily   pantoprazole  40 mg Oral  Daily   venlafaxine XR  150 mg Oral Daily   venlafaxine XR  75 mg Oral Daily   Continuous Infusions:  sodium chloride Stopped (11/20/22 0918)      LOS: 8 days    Time spent: 35 minutes    Ramiro Harvest, MD [facility]   To contact the attending provider between 7A-7P or the covering provider during after hours 7P-7A, please log into the web site [email or web address] and access using universal [place] password for that web site. If you do not have the password, please call the hospital operator.  11/20/2022, 1:16 PM

## 2022-11-20 NOTE — TOC Progression Note (Signed)
Transition of Care [facility]) - Progression Note    Patient Details  Name: Ann Green MRN: [id number] Date of Birth: 1922-04-20  Transition of Care [facility]) CM/SW Contact  Tempie Hoist, [place] Phone Number: 11/20/2022, 10:32 AM  Clinical Narrative:     TOC spoke to [facility] at [phone]. The appeal for the authorization is still pending. TOC can check back tomorrow regarding the status of the appeal.       Expected Discharge Plan and Services    Insurance Appeal is still pending     Expected Discharge Date: 11/15/22                                     Social Determinants of Health (SDOH) Interventions SDOH Screenings   Food Insecurity: No Food Insecurity (11/10/2022)  Housing: Low Risk  (11/10/2022)  Transportation Needs: No Transportation Needs (11/10/2022)  Utilities: Not At Risk (11/10/2022)  Tobacco Use: Low Risk  (11/10/2022)    Readmission Risk Interventions     No data to display

## 2022-11-21 DIAGNOSIS — G9341 Metabolic encephalopathy: Secondary | ICD-10-CM | POA: Diagnosis not present

## 2022-11-21 DIAGNOSIS — R531 Weakness: Secondary | ICD-10-CM | POA: Diagnosis not present

## 2022-11-21 DIAGNOSIS — N390 Urinary tract infection, site not specified: Secondary | ICD-10-CM | POA: Diagnosis not present

## 2022-11-21 DIAGNOSIS — E079 Disorder of thyroid, unspecified: Secondary | ICD-10-CM | POA: Diagnosis not present

## 2022-11-21 NOTE — TOC Progression Note (Signed)
Transition of Care [facility]) - Progression Note    Patient Details  Name: DANIJA GOSA MRN: [id number] Date of Birth: [birth year]  Transition of Care [facility]) CM/SW Contact  Tempie Hoist, [place] Phone Number: 11/21/2022, 9:44 AM  Clinical Narrative:     TOC called [employer] regarding the fast appeal at [phone].  As per the staff, the patient has to complete an appointed representative form. The patient is to complete this form. As per the staff, they have until December 03, 2022 to make a decision.       Expected Discharge Plan and Services      Appeal is pending   Expected Discharge Date: 11/15/22                                     Social Determinants of Health (SDOH) Interventions SDOH Screenings   Food Insecurity: No Food Insecurity (11/10/2022)  Housing: Low Risk  (11/10/2022)  Transportation Needs: No Transportation Needs (11/10/2022)  Utilities: Not At Risk (11/10/2022)  Tobacco Use: Low Risk  (11/10/2022)    Readmission Risk Interventions     No data to display

## 2022-11-21 NOTE — Progress Notes (Addendum)
Physical Therapy Treatment Patient Details Name: Ann Green MRN: [id number] DOB: October 12, 1922 Today's Date: 11/21/2022   History of Present Illness 88 y/o female presented to ED on 11/10/22 for AMS and UTI. Admitted for possible acute cystitis and L LE cellulitis. PMH: dementia, HTN, HLD    PT Comments    Pt seen for PT tx with pt received asleep but easily awakened & agreeable to tx. Pt requires mod assist for bed mobility, mod<>max assist for STS, but is able to progress to walking ~30 ft in room with RW & min assist. Pt is limited by posterior lean during STS & initially upon standing, requiring max assist to correct. Pt also experiences urinary incontinence upon each STS. Will continue to follow pt acutely to address transfers, balance, and endurance.  Of note, pt c/o "can't breathe" after gait followed by c/o dizziness, but vitals in chair: SpO2 99-100%, HR 73 bpm, BP in LUE 154/58 mmHg MAP 84. Nurse called to room & made aware.    Recommendations for follow up therapy are one component of a multi-disciplinary discharge planning process, led by the attending physician.  Recommendations may be updated based on patient status, additional functional criteria and insurance authorization.  Follow Up Recommendations  Skilled nursing-short term rehab (<3 hours/day) Can patient physically be transported by private vehicle: No   Assistance Recommended at Discharge Frequent or constant Supervision/Assistance  Patient can return home with the following Direct supervision/assist for financial management;Help with stairs or ramp for entrance;Direct supervision/assist for medications management;Assistance with cooking/housework;Assist for transportation;A lot of help with bathing/dressing/bathroom;A lot of help with walking and/or transfers   Equipment Recommendations  Rolling walker (2 wheels)    Recommendations for Other Services       Precautions / Restrictions Precautions Precautions:  Fall Restrictions Weight Bearing Restrictions: No     Mobility  Bed Mobility Overal bed mobility: Needs Assistance Bed Mobility: Supine to Sit     Supine to sit: Mod assist, HOB elevated     General bed mobility comments: cuing to use bed rails, assistance to scoot hips to EOB & upright trunk    Transfers Overall transfer level: Needs assistance Equipment used: Rolling walker (2 wheels) Transfers: Sit to/from Stand, Bed to chair/wheelchair/BSC Sit to Stand: Mod assist, Max assist   Step pivot transfers: Min assist, Mod assist       General transfer comment: Multimodal cuing throughout for hand placement, sequencing, PT blocks BLE feet to prevent sliding forward & provides facilitation for anterior weight shifting. Pt requires mod<>max assist for STS 2/2 posterior lean & once in standing requires max assist to obtain balance 2/2 posterior lean.    Ambulation/Gait Ambulation/Gait assistance: Min assist Gait Distance (Feet): 30 Feet Assistive device: Rolling walker (2 wheels) Gait Pattern/deviations: Decreased step length - left, Decreased step length - right, Decreased dorsiflexion - right, Decreased dorsiflexion - left, Decreased stride length Gait velocity: decreased     General Gait Details: decreased foot clearance   Stairs             Wheelchair Mobility    Modified Rankin (Stroke Patients Only)       Balance Overall balance assessment: History of Falls, Needs assistance Sitting-balance support: Feet supported Sitting balance-Leahy Scale: Fair     Standing balance support: Bilateral upper extremity supported, Reliant on assistive device for balance, During functional activity Standing balance-Leahy Scale: Poor  Cognition Arousal/Alertness: Awake/alert Behavior During Therapy: Flat affect Overall Cognitive Status: History of cognitive impairments - at baseline                                  General Comments: Follows simple commands throughout session.        Exercises      General Comments General comments (skin integrity, edema, etc.): Pt with urinary incontinence upon each STS.      Pertinent Vitals/Pain Pain Assessment Pain Assessment: Faces Faces Pain Scale: Hurts little more Pain Location: BLE thighs/groin Pain Descriptors / Indicators: Discomfort, Grimacing Pain Intervention(s): Limited activity within patient's tolerance, Repositioned, Monitored during session    Home Living                          Prior Function            PT Goals (current goals can now be found in the care plan section) Acute Rehab PT Goals Patient Stated Goal: did not state PT Goal Formulation: With patient Time For Goal Achievement: 11/25/22 Potential to Achieve Goals: Fair Additional Goals Additional Goal #1: Pt will be able to perform bed mobility/strength with cga and safe technique in order to improve functional independence Progress towards PT goals: Progressing toward goals    Frequency    Min 2X/week      PT Plan Equipment recommendations need to be updated;Current plan remains appropriate    Co-evaluation              AM-PAC PT "6 Clicks" Mobility   Outcome Measure  Help needed turning from your back to your side while in a flat bed without using bedrails?: A Lot Help needed moving from lying on your back to sitting on the side of a flat bed without using bedrails?: A Lot Help needed moving to and from a bed to a chair (including a wheelchair)?: A Little Help needed standing up from a chair using your arms (e.g., wheelchair or bedside chair)?: A Lot Help needed to walk in hospital room?: A Little Help needed climbing 3-5 steps with a railing? : Total 6 Click Score: 13    End of Session   Activity Tolerance: Patient tolerated treatment well;Patient limited by fatigue Patient left: in chair;with chair alarm set;with call bell/phone within  reach;with nursing/sitter in room Nurse Communication: Mobility status PT Visit Diagnosis: Unsteadiness on feet (R26.81);History of falling (Z91.81);Repeated falls (R29.6);Muscle weakness (generalized) (M62.81)     Time: 6060-0459 PT Time Calculation (min) (ACUTE ONLY): 20 min  Charges:  $Therapeutic Activity: 8-22 mins                     Aleda Grana, PT, DPT 11/21/22, 10:59 AM   Sandi Mariscal 11/21/2022, 10:58 AM

## 2022-11-21 NOTE — Progress Notes (Signed)
PROGRESS NOTE    Ann Green  ZCH:[id number] DOB: 1922-08-10 DOA: 11/10/2022 PCP: Tawnya Crook, MD   Brief Narrative:  Ann Green is a 88 y.o. female with dementia, hypertension, hyperlipidemia presented to hospital with generalized weakness, fatigue and tiredness with episodes of confusion and not acting herself.  Patient lives at home and has been taken care of by caregivers.  Patient was initially prescribed Macrobid by her primary care physician and had taken 1 dose this but today she developed some slurred speech and some weakness in the left arm so she was brought to the hospital.  By the time she came to the hospital her weakness and slurred speech had improved.  Patient denies any nausea, vomiting, abdominal pain, fever or chills.  Denies any chest pain, shortness of breath, dyspnea.  Denies any syncope or lightheadedness.  Denies any sick contacts or recent travel.  Denies any runny nose, sore throat.  Denies any urinary urgency, frequency or dysuria.  Initial vitals in the ED was notable for elevated blood pressure with some bradycardia.  Patient did have some left foot cellulitis.  Does have a history of bradycardia at baseline.  Labs in the ED showed hemoglobin of 9.8 no leukocytosis.  Creatinine 0.8.  UA showed some white cells from 11/10/2022 done outside.  Urine culture was sent from the ED.  Patient was started on Rocephin IV and was considered for admission to the hospital for generalized weakness, confusion, possible cellulitis.   **Interim History She started improved or hospitalization and was seen by PT and OT who recommended SNF.  She is being medically stable for discharge however her insurance authorization was denied and then appealed and the appeal for the insurance authorization is still pending.  Assessment and Plan: No notes have been filed under this hospital service. Service: Hospitalist  Generalized weakness/Fatigue -Likely secondary to possible UTI,  dehydration. -Patient noted to have been taking care of by caretaker at home but assessment by PT recommending SNF placement at this time. -TSH, vitamin B12 low normal range.  Vitamin D levels at 53. -TOC consulted for SNF placement. -Insurance declined SNF placement, Dr. Janee Morn engaged in the peer to peer with MD at insurance company on 11/16/2022 and SNF placement declined again at that time. -Family have appealed which is in process and still pending.   Chest wall pain -Patient noted to have some chest wall pain which was reproducible on palpation, improving clinically. -EKG done with normal sinus rhythm with no ischemic changes. -Clinical improvement on Voltaren gel. -Continue Voltaren gel as needed.   Possible acute cystitis -Patient noted to have been prescribed Macrobid in the outpatient setting prior to admission. -Urine cultures done during this hospitalization with no growth. -Status post 3 days IV Rocephin. -No further antibiotics needed at this time given completed course   Acute Metabolic Encephalopathy/Mild Confusion -Felt likely secondary to infectious etiology of UTI in the setting of underlying dementia. -Status post 3 days IV Rocephin.   -Clinical improvement.  Likely at baseline given her age.     Concern for TIA with some focal weakness -Patient already noted to be on aspirin prior to admission. -Head CT done with no acute abnormalities. -Per Dr. Tyson Babinski, family did not wish to pursue any further workup including MRI as patient had improved and imaging would not likely change current management. -Continue aspirin on discharge for secondary stroke prophylaxis.   Hypothyroidism -Continue Levothyroxine -TSH was 0.443.    History of Dementia -Per family patient  noted with some bouts of confusion and some vivid dreams 2 nights ago.. -Being cared by caregivers. -Continue Venlafaxine XR 150 mg and 75 mg Daily. -Patient placed on Zyprexa 2.5 mg nightly and Tylenol  500 mg on12/22/2023 however per daughter patient slept well, confusion improved however patient sleeping most of the morning 11/17/2022 and as such Zyprexa discontinued and patient just maintained on Tylenol which she seems to be tolerating. -Patient seems to have tolerated Remeron overnight as needed.   -Supportive care.    Dehydration -Patient with slight bump in BUN/creatinine. Recent Labs  Lab 11/10/22 1334 11/11/22 0512 11/14/22 0502 11/15/22 0422 11/18/22 0759 11/19/22 0905 11/20/22 0811  BUN 21 19 29* 33* 44* 36* 34*  CREATININE 0.84 0.87 1.19* 1.03* 1.25* 0.99 1.03*  -Per family patient noted with some poor oral intake. -Status post gentle hydration with improvement so discontinued IV fluids.    DVT prophylaxis: enoxaparin (LOVENOX) injection 30 mg Start: 11/12/22 2200    Code Status: DNR Family Communication: No family currently at bedside  Disposition Plan:  Level of care: Med-Surg Status is: Inpatient Remains inpatient appropriate because: Currently awaiting the appeal decision for SNF authorization   Consultants:  None  Procedures:  As delineated as above  Antimicrobials:  Anti-infectives (From admission, onward)    Start     Dose/Rate Route Frequency Ordered Stop   11/11/22 1600  cefTRIAXone (ROCEPHIN) 1 g in sodium chloride 0.9 % 100 mL IVPB  Status:  Discontinued        1 g 200 mL/hr over 30 Minutes Intravenous Every 24 hours 11/10/22 1819 11/13/22 1201   11/10/22 1700  cefTRIAXone (ROCEPHIN) 2 g in sodium chloride 0.9 % 100 mL IVPB        2 g 200 mL/hr over 30 Minutes Intravenous  Once 11/10/22 1646 11/10/22 1820       Subjective: Seen and examined at bedside she is resting in the bed and no family is at bedside.  She is little confused and wanting to go home.  Denies any chest pain or shortness breath.  No other concerns or close at this time.  Objective: Vitals:   11/20/22 2022 11/21/22 0256 11/21/22 0600 11/21/22 0803  BP: (!) 148/53  (!)  157/52 (!) 142/60  Pulse: 65  62 62  Resp: 17  17 20   Temp: 99.1 F (37.3 C)  (!) 97.4 F (36.3 C) 97.8 F (36.6 C)  TempSrc: Oral     SpO2: 100%  99% 97%  Weight:  64.8 kg    Height:        Intake/Output Summary (Last 24 hours) at 11/21/2022 0809 Last data filed at 11/21/2022 0120 Gross per 24 hour  Intake 773.17 ml  Output 1350 ml  Net -576.83 ml   Filed Weights   11/19/22 0500 11/20/22 0357 11/21/22 0256  Weight: 64.8 kg 64.2 kg 64.8 kg   Examination: Physical Exam:  Constitutional: Elderly Caucasian female in no acute distress Respiratory: Diminished to auscultation bilaterally, no wheezing, rales, rhonchi or crackles. Normal respiratory effort and patient is not tachypenic. No accessory muscle use.  Unlabored breathing Cardiovascular: RRR, no murmurs / rubs / gallops. S1 and S2 auscultated. No extremity edema. Abdomen: Soft, non-tender, non-distended. Bowel sounds positive.  GU: Deferred. Musculoskeletal: No clubbing / cyanosis of digits/nails. No joint deformity upper and lower extremities.  Skin: No rashes, lesions, ulcers limited skin evaluation. No induration; Warm and dry.  Neurologic: CN 2-12 grossly intact with no focal deficits. Romberg sign cerebellar reflexes  not assessed.  Psychiatric: Impaired judgment and insight she is awake and alert but not fully oriented  Data Reviewed: I have personally reviewed following labs and imaging studies  CBC: No results for input(s): "WBC", "NEUTROABS", "HGB", "HCT", "MCV", "PLT" in the last 168 hours. Basic Metabolic Panel: Recent Labs  Lab 11/15/22 0422 11/18/22 0759 11/19/22 0905 11/20/22 0811  NA 141 138 140 142  K 4.3 4.3 4.2 4.3  CL 110 108 111 115*  CO2 27 24 23 22   GLUCOSE 90 119* 111* 93  BUN 33* 44* 36* 34*  CREATININE 1.03* 1.25* 0.99 1.03*  CALCIUM 8.5* 8.9 8.4* 8.4*   GFR: Estimated Creatinine Clearance: 25.6 mL/min (A) (by C-G formula based on SCr of 1.03 mg/dL (H)). Liver Function Tests: No  results for input(s): "AST", "ALT", "ALKPHOS", "BILITOT", "PROT", "ALBUMIN" in the last 168 hours. No results for input(s): "LIPASE", "AMYLASE" in the last 168 hours. No results for input(s): "AMMONIA" in the last 168 hours. Coagulation Profile: No results for input(s): "INR", "PROTIME" in the last 168 hours. Cardiac Enzymes: No results for input(s): "CKTOTAL", "CKMB", "CKMBINDEX", "TROPONINI" in the last 168 hours. BNP (last 3 results) No results for input(s): "PROBNP" in the last 8760 hours. HbA1C: No results for input(s): "HGBA1C" in the last 72 hours. CBG: No results for input(s): "GLUCAP" in the last 168 hours. Lipid Profile: No results for input(s): "CHOL", "HDL", "LDLCALC", "TRIG", "CHOLHDL", "LDLDIRECT" in the last 72 hours. Thyroid Function Tests: No results for input(s): "TSH", "T4TOTAL", "FREET4", "T3FREE", "THYROIDAB" in the last 72 hours. Anemia Panel: No results for input(s): "VITAMINB12", "FOLATE", "FERRITIN", "TIBC", "IRON", "RETICCTPCT" in the last 72 hours. Sepsis Labs: No results for input(s): "PROCALCITON", "LATICACIDVEN" in the last 168 hours.  Recent Results (from the past 240 hour(s))  Urine Culture     Status: None   Collection Time: 11/13/22  7:30 AM   Specimen: Urine, Clean Catch  Result Value Ref Range Status   Specimen Description   Final    URINE, CLEAN CATCH Performed at [facility], [address], [place], [place]    Special Requests   Final    NONE Performed at [facility], [address] [place], [place]    Culture   Final    NO GROWTH Performed at [facility], [address], [place], [place]    Report Status 11/14/2022 FINAL  Final  Resp panel by RT-PCR (RSV, Flu A&B, Covid) Anterior Nasal Swab     Status: None   Collection Time: 11/19/22 12:12 PM   Specimen: Anterior Nasal Swab  Result Value Ref Range Status   SARS Coronavirus 2 by RT PCR NEGATIVE NEGATIVE Final    Comment:  (NOTE) SARS-CoV-2 target nucleic acids are NOT DETECTED.  The SARS-CoV-2 RNA is generally detectable in upper respiratory specimens during the acute phase of infection. The lowest concentration of SARS-CoV-2 viral copies this assay can detect is 138 copies/mL. A negative result does not preclude SARS-Cov-2 infection and should not be used as the sole basis for treatment or other patient management decisions. A negative result may occur with  improper specimen collection/handling, submission of specimen other than nasopharyngeal swab, presence of viral mutation(s) within the areas targeted by this assay, and inadequate number of viral copies(<138 copies/mL). A negative result must be combined with clinical observations, patient history, and epidemiological information. The expected result is Negative.  Fact Sheet for Patients:  11/21/22  Fact Sheet for Healthcare Providers:  SeriousBroker.ithttps://www.fda.gov/media/152162/download  This test is no t yet approved or cleared by the [place] FDA and  has been authorized for detection and/or diagnosis of SARS-CoV-2 by FDA under an Emergency Use Authorization (EUA). This EUA will remain  in effect (meaning this test can be used) for the duration of the COVID-19 declaration under Section 564(b)(1) of the Act, 21 U.S.C.section 360bbb-3(b)(1), unless the authorization is terminated  or revoked sooner.       Influenza A by PCR NEGATIVE NEGATIVE Final   Influenza B by PCR NEGATIVE NEGATIVE Final    Comment: (NOTE) The Xpert Xpress SARS-CoV-2/FLU/RSV plus assay is intended as an aid in the diagnosis of influenza from Nasopharyngeal swab specimens and should not be used as a sole basis for treatment. Nasal washings and aspirates are unacceptable for Xpert Xpress SARS-CoV-2/FLU/RSV testing.  Fact Sheet for Patients: BloggerCourse.comhttps://www.fda.gov/media/152166/download  Fact Sheet for Healthcare  Providers: SeriousBroker.ithttps://www.fda.gov/media/152162/download  This test is not yet approved or cleared by the [place] FDA and has been authorized for detection and/or diagnosis of SARS-CoV-2 by FDA under an Emergency Use Authorization (EUA). This EUA will remain in effect (meaning this test can be used) for the duration of the COVID-19 declaration under Section 564(b)(1) of the Act, 21 U.S.C. section 360bbb-3(b)(1), unless the authorization is terminated or revoked.     Resp Syncytial Virus by PCR NEGATIVE NEGATIVE Final    Comment: (NOTE) Fact Sheet for Patients: BloggerCourse.comhttps://www.fda.gov/media/152166/download  Fact Sheet for Healthcare Providers: SeriousBroker.ithttps://www.fda.gov/media/152162/download  This test is not yet approved or cleared by the [place] FDA and has been authorized for detection and/or diagnosis of SARS-CoV-2 by FDA under an Emergency Use Authorization (EUA). This EUA will remain in effect (meaning this test can be used) for the duration of the COVID-19 declaration under Section 564(b)(1) of the Act, 21 U.S.C. section 360bbb-3(b)(1), unless the authorization is terminated or revoked.  Performed at [facility], [address] [place], [place] [id number]     Radiology Studies: No results found.  Scheduled Meds:  acetaminophen  500 mg Oral QHS   aspirin  81 mg Oral Daily   cholecalciferol  1,000 Units Oral Daily   docusate sodium  100 mg Oral BID   enoxaparin (LOVENOX) injection  30 mg Subcutaneous Q24H   ferrous sulfate  325 mg Oral Daily   fluticasone  1 spray Each Nare Daily   influenza vaccine adjuvanted  0.5 mL Intramuscular Tomorrow-1000   levothyroxine  75 mcg Oral Q0600   loratadine  10 mg Oral Daily   pantoprazole  40 mg Oral Daily   venlafaxine XR  150 mg Oral Daily   venlafaxine XR  75 mg Oral Daily   Continuous Infusions:   LOS: 9 days   Marguerita Merlesmair Emmry Hinsch, DO [facility] Available via Epic secure chat 7am-7pm After these hours,  please refer to coverage provider listed on [email or web address] 11/21/2022, 8:09 AM

## 2022-11-22 DIAGNOSIS — G9341 Metabolic encephalopathy: Secondary | ICD-10-CM | POA: Diagnosis not present

## 2022-11-22 DIAGNOSIS — R531 Weakness: Secondary | ICD-10-CM | POA: Diagnosis not present

## 2022-11-22 DIAGNOSIS — I10 Essential (primary) hypertension: Secondary | ICD-10-CM | POA: Diagnosis not present

## 2022-11-22 DIAGNOSIS — N390 Urinary tract infection, site not specified: Secondary | ICD-10-CM | POA: Diagnosis not present

## 2022-11-22 LAB — CBC WITH DIFFERENTIAL/PLATELET
Abs Immature Granulocytes: 0.03 10*3/uL (ref 0.00–0.07)
Basophils Absolute: 0.1 10*3/uL (ref 0.0–0.1)
Basophils Relative: 1 %
Eosinophils Absolute: 0.3 10*3/uL (ref 0.0–0.5)
Eosinophils Relative: 5 %
HCT: 35.2 % — ABNORMAL LOW (ref 36.0–46.0)
Hemoglobin: 11 g/dL — ABNORMAL LOW (ref 12.0–15.0)
Immature Granulocytes: 1 %
Lymphocytes Relative: 19 %
Lymphs Abs: 1.1 10*3/uL (ref 0.7–4.0)
MCH: 26.9 pg (ref 26.0–34.0)
MCHC: 31.3 g/dL (ref 30.0–36.0)
MCV: 86.1 fL (ref 80.0–100.0)
Monocytes Absolute: 0.6 10*3/uL (ref 0.1–1.0)
Monocytes Relative: 10 %
Neutro Abs: 3.8 10*3/uL (ref 1.7–7.7)
Neutrophils Relative %: 64 %
Platelets: 246 10*3/uL (ref 150–400)
RBC: 4.09 MIL/uL (ref 3.87–5.11)
RDW: 19 % — ABNORMAL HIGH (ref 11.5–15.5)
WBC: 5.8 10*3/uL (ref 4.0–10.5)
nRBC: 0 % (ref 0.0–0.2)

## 2022-11-22 LAB — COMPREHENSIVE METABOLIC PANEL
ALT: 96 U/L — ABNORMAL HIGH (ref 0–44)
AST: 53 U/L — ABNORMAL HIGH (ref 15–41)
Albumin: 3 g/dL — ABNORMAL LOW (ref 3.5–5.0)
Alkaline Phosphatase: 117 U/L (ref 38–126)
Anion gap: 6 (ref 5–15)
BUN: 32 mg/dL — ABNORMAL HIGH (ref 8–23)
CO2: 23 mmol/L (ref 22–32)
Calcium: 9 mg/dL (ref 8.9–10.3)
Chloride: 112 mmol/L — ABNORMAL HIGH (ref 98–111)
Creatinine, Ser: 1 mg/dL (ref 0.44–1.00)
GFR, Estimated: 51 mL/min — ABNORMAL LOW (ref >60)
Glucose, Bld: 82 mg/dL (ref 70–99)
Potassium: 3.8 mmol/L (ref 3.5–5.1)
Sodium: 141 mmol/L (ref 135–145)
Total Bilirubin: 0.5 mg/dL (ref 0.3–1.2)
Total Protein: 6.3 g/dL — ABNORMAL LOW (ref 6.5–8.1)

## 2022-11-22 LAB — MAGNESIUM: Magnesium: 1.8 mg/dL (ref 1.7–2.4)

## 2022-11-22 LAB — PHOSPHORUS: Phosphorus: 4 mg/dL (ref 2.5–4.6)

## 2022-11-22 MED ORDER — PNEUMOCOCCAL 20-VAL CONJ VACC 0.5 ML IM SUSY: 0.5000 mL | PREFILLED_SYRINGE | INTRAMUSCULAR | Status: DC

## 2022-11-22 MED ORDER — OLANZAPINE 2.5 MG PO TABS
2.5000 mg | ORAL_TABLET | Freq: Every day | ORAL | Status: DC
  Administered 2022-11-22 – 2022-11-27 (×6): 2.5 mg via ORAL
  Filled 2022-11-22 (×6): qty 1

## 2022-11-22 MED ORDER — INFLUENZA VAC A&B SA ADJ QUAD 0.5 ML IM PRSY
0.5000 mL | PREFILLED_SYRINGE | Freq: Once | INTRAMUSCULAR | Status: AC
  Administered 2022-11-22: 0.5 mL via INTRAMUSCULAR
  Filled 2022-11-22: qty 0.5

## 2022-11-22 MED ORDER — PNEUMOCOCCAL 20-VAL CONJ VACC 0.5 ML IM SUSY
0.5000 mL | PREFILLED_SYRINGE | Freq: Once | INTRAMUSCULAR | Status: AC
  Administered 2022-11-22: 0.5 mL via INTRAMUSCULAR
  Filled 2022-11-22: qty 0.5

## 2022-11-22 MED ORDER — MAGNESIUM SULFATE 2 GM/50ML IV SOLN
2.0000 g | Freq: Once | INTRAVENOUS | Status: AC
  Administered 2022-11-22: 2 g via INTRAVENOUS
  Filled 2022-11-22: qty 50

## 2022-11-22 NOTE — Care Management Important Message (Signed)
Important Message  Patient Details  Name: Ann Green MRN: [id number] Date of Birth: Jun 13, 1922   Medicare Important Message Given:  Yes     Olegario Messier A Delinda Malan 11/22/2022, 11:15 AM

## 2022-11-22 NOTE — Progress Notes (Signed)
PT Cancellation Note  Patient Details Name: Ann Green MRN: [id number] DOB: 06-13-1922   Cancelled Treatment:     PT attempt. Pt politely refused. Dinner tray arrived and pt requested author to return in the morning. Pt's supportive son at bedside. Awaiting appeal from insurance. Acute PT will continue to follow and progress as able per current POC.    Rushie Chestnut 11/22/2022, 5:16 PM

## 2022-11-22 NOTE — Progress Notes (Signed)
PROGRESS NOTE    Ann Green  WUJ:[id number] DOB: May 10, 1922 DOA: 11/10/2022 PCP: Tawnya Crookoche, June[place], MD   Brief Narrative:  Ann LikeVera C Mandelbaum is a 88 y.o. female with dementia, hypertension, hyperlipidemia presented to hospital with generalized weakness, fatigue and tiredness with episodes of confusion and not acting herself.  Patient lives at home and has been taken care of by caregivers.  Patient was initially prescribed Macrobid by her primary care physician and had taken 1 dose this but today she developed some slurred speech and some weakness in the left arm so she was brought to the hospital.  By the time she came to the hospital her weakness and slurred speech had improved.  Patient denies any nausea, vomiting, abdominal pain, fever or chills.  Denies any chest pain, shortness of breath, dyspnea.  Denies any syncope or lightheadedness.  Denies any sick contacts or recent travel.  Denies any runny nose, sore throat.  Denies any urinary urgency, frequency or dysuria.  Initial vitals in the ED was notable for elevated blood pressure with some bradycardia.  Patient did have some left foot cellulitis.  Does have a history of bradycardia at baseline.  Labs in the ED showed hemoglobin of 9.8 no leukocytosis.  Creatinine 0.8.  UA showed some white cells from 11/10/2022 done outside.  Urine culture was sent from the ED.  Patient was started on Rocephin IV and was considered for admission to the hospital for generalized weakness, confusion, possible cellulitis.  **Interim History She started improved or hospitalization and was seen by PT and OT who recommended SNF.  She is being medically stable for discharge however her insurance authorization was denied and then appealed and the appeal for the insurance authorization is still pending.   Assessment and Plan:  Generalized weakness/Fatigue -Likely secondary to possible UTI, dehydration. -Patient noted to have been taking care of by caretaker at home but  assessment by PT recommending SNF placement at this time. -TSH, vitamin B12 low normal range.  Vitamin D levels at 53. -TOC consulted for SNF placement. -Insurance declined SNF placement, Dr. Janee Mornhompson engaged in the peer to peer with MD at insurance company on 11/16/2022 and SNF placement declined again at that time. -Family have appealed which is in process and the appeal is still pending   Chest wall pain -Patient noted to have some chest wall pain which was reproducible on palpation, improving clinically. -EKG done with normal sinus rhythm with no ischemic changes. -Clinical improvement on Voltaren gel. -Continue Voltaren gel as needed.   Possible acute cystitis -Patient noted to have been prescribed Macrobid in the outpatient setting prior to admission. -Urine cultures done during this hospitalization with no growth. -Status post 3 days IV Rocephin. -No further antibiotics needed at this time given completed course   Acute Metabolic Encephalopathy/Mild Confusion -Felt likely secondary to infectious etiology of UTI in the setting of underlying dementia. -Status post 3 days IV Rocephin.   -Clinical improvement.  Likely at baseline given her age.     Concern for TIA with some focal weakness -Patient already noted to be on aspirin prior to admission. -Head CT done with no acute abnormalities. -Per Dr. Tyson BabinskiPokhrel, family did not wish to pursue any further workup including MRI as patient had improved and imaging would not likely change current management. -Continue Aspirin on discharge for secondary stroke prophylaxis.   Hypothyroidism -Continue Levothyroxine -TSH was 0.443.    History of Dementia -Per family patient noted with some bouts of confusion and some  vivid dreams several nights ago. -Being cared by caregivers. -Continue Venlafaxine XR 150 mg and 75 mg Daily. -Patient placed on Zyprexa 2.5 mg nightly and Tylenol 500 mg on12/22/2023 however per daughter patient slept well,  confusion improved however patient sleeping most of the morning 11/17/2022 and as such Zyprexa discontinued and patient just maintained on Tylenol which she seems to be tolerating. -Patient seems to have tolerated Remeron overnight as needed.   -Supportive care.    Dehydration -Patient with slight bump in BUN/creatinine. -BUN/Cr Trend: Recent Labs  Lab 11/11/22 0512 11/14/22 0502 11/15/22 0422 11/18/22 0759 11/19/22 0905 11/20/22 0811 11/22/22 0328  BUN 19 29* 33* 44* 36* 34* 32*  CREATININE 0.87 1.19* 1.03* 1.25* 0.99 1.03* 1.00  -Per family patient noted with some poor oral intake. -Status post gentle hydration with improvement so discontinued IV fluids.    Abnormal LFTs -Mild and Likely Reactive -AST is now 53 and ALT is now 96 -Repeat CMP in the AM and consider obtaining a RUQ U/S and Acute Hepatitis Panel in the AM -Repeat CMP in the AM   Normocytic Anemia -Hgb/Hct Trend: Recent Labs  Lab 11/10/22 1334 11/11/22 0512 11/14/22 0502 11/22/22 0328  HGB 9.8* 11.0* 10.1* 11.0*  HCT 33.1* 36.2 33.2* 35.2*  MCV 89.7 86.2 86.9 86.1  -Continue to monitor for signs and symptoms of bleeding; no overt bleeding noted -Repeat CBC in a.m.  Hypoalbuminemia -Patient's Albumin is now 3.0 -Continue to Monitor and Trend -Repeat CMP in the AM   DVT prophylaxis: enoxaparin (LOVENOX) injection 30 mg Start: 11/12/22 2200    Code Status: DNR Family Communication: Discussed with family present at bedside   Disposition Plan:  Level of care: Med-Surg Status is: Inpatient Remains inpatient appropriate because:    Consultants:  None  Procedures:  As delineated as above   Antimicrobials:  Anti-infectives (From admission, onward)    Start     Dose/Rate Route Frequency Ordered Stop   11/11/22 1600  cefTRIAXone (ROCEPHIN) 1 g in sodium chloride 0.9 % 100 mL IVPB  Status:  Discontinued        1 g 200 mL/hr over 30 Minutes Intravenous Every 24 hours 11/10/22 1819 11/13/22 1201    11/10/22 1700  cefTRIAXone (ROCEPHIN) 2 g in sodium chloride 0.9 % 100 mL IVPB        2 g 200 mL/hr over 30 Minutes Intravenous  Once 11/10/22 1646 11/10/22 1820       Subjective: Seen and examined at bedside and she is doing much better this morning and was awake and alert and still awaiting further insurance appeals process.  She denies any lightheadedness or dizziness.  Denies any chest pain and no other concerns or complaints this time.  Objective: Vitals:   11/22/22 0208 11/22/22 0521 11/22/22 0741 11/22/22 1552  BP:  (!) 162/58 (!) 158/51 (!) 152/53  Pulse:  64 66 74  Resp:  Temp:  98 F (36.7 C) 97.9 F (36.6 C) 98.4 F (36.9 C)  TempSrc:  Oral    SpO2:  99% 98% 94%  Weight: 62.9 kg     Height:        Intake/Output Summary (Last 24 hours) at 11/22/2022 1643 Last data filed at 11/22/2022 1434 Gross per 24 hour  Intake 0 ml  Output 1100 ml  Net -1100 ml   Filed Weights   11/20/22 0357 11/21/22 0256 11/22/22 0208  Weight: 64.2 kg 64.8 kg 62.9 kg   Examination: Physical Exam:  Constitutional: Elderly Caucasian female, no acute distress Respiratory: Diminished to auscultation bilaterally, no wheezing, rales, rhonchi or crackles. Normal respiratory effort and patient is not tachypenic. No accessory muscle use.  Unlabored breathing Cardiovascular: RRR, no murmurs / rubs / gallops. S1 and S2 auscultated. No extremity edema.  Abdomen: Soft, non-tender, non-distended. Bowel sounds positive.  GU: Deferred. Musculoskeletal: No clubbing / cyanosis of digits/nails. No joint deformity upper and lower extremities.  Skin: No rashes, lesions, ulcers limited skin evaluation. No induration; Warm and dry.  Neurologic: CN 2-12 grossly intact with no focal deficits. Romberg sign and cerebellar reflexes not assessed.  Psychiatric: Impaired judgment and insight she is awake and alert and oriented x 1  Data Reviewed: I have personally reviewed following labs and imaging  studies  CBC: Recent Labs  Lab 11/22/22 0328  WBC 5.8  NEUTROABS 3.8  HGB 11.0*  HCT 35.2*  MCV 86.1  PLT 246   Basic Metabolic Panel: Recent Labs  Lab 11/18/22 0759 11/19/22 0905 11/20/22 0811 11/22/22 0328  NA 138 140 142 141  K 4.3 4.2 4.3 3.8  CL 108 111 115* 112*  CO2 24 23 22 23   GLUCOSE 119* 111* 93 82  BUN 44* 36* 34* 32*  CREATININE 1.25* 0.99 1.03* 1.00  CALCIUM 8.9 8.4* 8.4* 9.0  MG  --   --   --  1.8  PHOS  --   --   --  4.0   GFR: Estimated Creatinine Clearance: 26 mL/min (by C-G formula based on SCr of 1 mg/dL). Liver Function Tests: Recent Labs  Lab 11/22/22 0328  AST 53*  ALT 96*  ALKPHOS 117  BILITOT 0.5  PROT 6.3*  ALBUMIN 3.0*   No results for input(s): "LIPASE", "AMYLASE" in the last 168 hours. No results for input(s): "AMMONIA" in the last 168 hours. Coagulation Profile: No results for input(s): "INR", "PROTIME" in the last 168 hours. Cardiac Enzymes: No results for input(s): "CKTOTAL", "CKMB", "CKMBINDEX", "TROPONINI" in the last 168 hours. BNP (last 3 results) No results for input(s): "PROBNP" in the last 8760 hours. HbA1C: No results for input(s): "HGBA1C" in the last 72 hours. CBG: No results for input(s): "GLUCAP" in the last 168 hours. Lipid Profile: No results for input(s): "CHOL", "HDL", "LDLCALC", "TRIG", "CHOLHDL", "LDLDIRECT" in the last 72 hours. Thyroid Function Tests: No results for input(s): "TSH", "T4TOTAL", "FREET4", "T3FREE", "THYROIDAB" in the last 72 hours. Anemia Panel: No results for input(s): "VITAMINB12", "FOLATE", "FERRITIN", "TIBC", "IRON", "RETICCTPCT" in the last 72 hours. Sepsis Labs: No results for input(s): "PROCALCITON", "LATICACIDVEN" in the last 168 hours.  Recent Results (from the past 240 hour(s))  Urine Culture     Status: None   Collection Time: 11/13/22  7:30 AM   Specimen: Urine, Clean Catch  Result Value Ref Range Status   Specimen Description   Final    URINE, CLEAN CATCH Performed  at [facility], [address] [place], [place]    Special Requests   Final    NONE Performed at [facility], [address] [place], [place]    Culture   Final    NO GROWTH Performed at [facility], [address] [place], [place]    Report Status 11/14/2022 FINAL  Final  Resp panel by RT-PCR (RSV, Flu A&B, Covid) Anterior Nasal Swab     Status: None   Collection Time: 11/19/22 12:12 PM   Specimen: Anterior Nasal Swab  Result Value Ref Range Status  SARS Coronavirus 2 by RT PCR NEGATIVE NEGATIVE Final    Comment: (NOTE) SARS-CoV-2 target nucleic acids are NOT DETECTED.  The SARS-CoV-2 RNA is generally detectable in upper respiratory specimens during the acute phase of infection. The lowest concentration of SARS-CoV-2 viral copies this assay can detect is 138 copies/mL. A negative result does not preclude SARS-Cov-2 infection and should not be used as the sole basis for treatment or other patient management decisions. A negative result may occur with  improper specimen collection/handling, submission of specimen other than nasopharyngeal swab, presence of viral mutation(s) within the areas targeted by this assay, and inadequate number of viral copies(<138 copies/mL). A negative result must be combined with clinical observations, patient history, and epidemiological information. The expected result is Negative.  Fact Sheet for Patients:  BloggerCourse.com  Fact Sheet for Healthcare Providers:  SeriousBroker.it  This test is no t yet approved or cleared by the [place] FDA and  has been authorized for detection and/or diagnosis of SARS-CoV-2 by FDA under an Emergency Use Authorization (EUA). This EUA will remain  in effect (meaning this test can be used) for the duration of the COVID-19 declaration under Section 564(b)(1) of the Act, 21 U.S.C.section  360bbb-3(b)(1), unless the authorization is terminated  or revoked sooner.       Influenza A by PCR NEGATIVE NEGATIVE Final   Influenza B by PCR NEGATIVE NEGATIVE Final    Comment: (NOTE) The Xpert Xpress SARS-CoV-2/FLU/RSV plus assay is intended as an aid in the diagnosis of influenza from Nasopharyngeal swab specimens and should not be used as a sole basis for treatment. Nasal washings and aspirates are unacceptable for Xpert Xpress SARS-CoV-2/FLU/RSV testing.  Fact Sheet for Patients: BloggerCourse.com  Fact Sheet for Healthcare Providers: SeriousBroker.it  This test is not yet approved or cleared by the [place] FDA and has been authorized for detection and/or diagnosis of SARS-CoV-2 by FDA under an Emergency Use Authorization (EUA). This EUA will remain in effect (meaning this test can be used) for the duration of the COVID-19 declaration under Section 564(b)(1) of the Act, 21 U.S.C. section 360bbb-3(b)(1), unless the authorization is terminated or revoked.     Resp Syncytial Virus by PCR NEGATIVE NEGATIVE Final    Comment: (NOTE) Fact Sheet for Patients: BloggerCourse.com  Fact Sheet for Healthcare Providers: SeriousBroker.it  This test is not yet approved or cleared by the [place] FDA and has been authorized for detection and/or diagnosis of SARS-CoV-2 by FDA under an Emergency Use Authorization (EUA). This EUA will remain in effect (meaning this test can be used) for the duration of the COVID-19 declaration under Section 564(b)(1) of the Act, 21 U.S.C. section 360bbb-3(b)(1), unless the authorization is terminated or revoked.  Performed at [facility], [address], [place], [place] [address]     Radiology Studies: No results found.  Scheduled Meds:  aspirin  81 mg Oral Daily   cholecalciferol  1,000 Units Oral Daily   docusate sodium   100 mg Oral BID   enoxaparin (LOVENOX) injection  30 mg Subcutaneous Q24H   ferrous sulfate  325 mg Oral Daily   fluticasone  1 spray Each Nare Daily   levothyroxine  75 mcg Oral Q0600   loratadine  10 mg Oral Daily   OLANZapine  2.5 mg Oral Daily   pantoprazole  40 mg Oral Daily   venlafaxine XR  150 mg Oral Daily   venlafaxine XR  75 mg Oral Daily   Continuous Infusions:   LOS:  10 days   Marguerita Merles, DO [facility] Available via Epic secure chat 7am-7pm After these hours, please refer to coverage provider listed on [email or web address] 11/22/2022, 4:43 PM

## 2022-11-23 DIAGNOSIS — R531 Weakness: Secondary | ICD-10-CM | POA: Diagnosis not present

## 2022-11-23 DIAGNOSIS — N390 Urinary tract infection, site not specified: Secondary | ICD-10-CM | POA: Diagnosis not present

## 2022-11-23 DIAGNOSIS — I10 Essential (primary) hypertension: Secondary | ICD-10-CM | POA: Diagnosis not present

## 2022-11-23 DIAGNOSIS — G9341 Metabolic encephalopathy: Secondary | ICD-10-CM | POA: Diagnosis not present

## 2022-11-23 LAB — CBC WITH DIFFERENTIAL/PLATELET
Abs Immature Granulocytes: 0.05 10*3/uL (ref 0.00–0.07)
Basophils Absolute: 0 10*3/uL (ref 0.0–0.1)
Basophils Relative: 1 %
Eosinophils Absolute: 0.1 10*3/uL (ref 0.0–0.5)
Eosinophils Relative: 1 %
HCT: 37.3 % (ref 36.0–46.0)
Hemoglobin: 12 g/dL (ref 12.0–15.0)
Immature Granulocytes: 1 %
Lymphocytes Relative: 9 %
Lymphs Abs: 0.7 10*3/uL (ref 0.7–4.0)
MCH: 27 pg (ref 26.0–34.0)
MCHC: 32.2 g/dL (ref 30.0–36.0)
MCV: 83.8 fL (ref 80.0–100.0)
Monocytes Absolute: 0.7 10*3/uL (ref 0.1–1.0)
Monocytes Relative: 10 %
Neutro Abs: 5.7 10*3/uL (ref 1.7–7.7)
Neutrophils Relative %: 78 %
Platelets: 262 10*3/uL (ref 150–400)
RBC: 4.45 MIL/uL (ref 3.87–5.11)
RDW: 19 % — ABNORMAL HIGH (ref 11.5–15.5)
WBC: 7.2 10*3/uL (ref 4.0–10.5)
nRBC: 0 % (ref 0.0–0.2)

## 2022-11-23 LAB — COMPREHENSIVE METABOLIC PANEL
ALT: 87 U/L — ABNORMAL HIGH (ref 0–44)
AST: 51 U/L — ABNORMAL HIGH (ref 15–41)
Albumin: 3.5 g/dL (ref 3.5–5.0)
Alkaline Phosphatase: 139 U/L — ABNORMAL HIGH (ref 38–126)
Anion gap: 9 (ref 5–15)
BUN: 26 mg/dL — ABNORMAL HIGH (ref 8–23)
CO2: 22 mmol/L (ref 22–32)
Calcium: 9.2 mg/dL (ref 8.9–10.3)
Chloride: 109 mmol/L (ref 98–111)
Creatinine, Ser: 0.98 mg/dL (ref 0.44–1.00)
GFR, Estimated: 52 mL/min — ABNORMAL LOW (ref >60)
Glucose, Bld: 109 mg/dL — ABNORMAL HIGH (ref 70–99)
Potassium: 3.8 mmol/L (ref 3.5–5.1)
Sodium: 140 mmol/L (ref 135–145)
Total Bilirubin: 0.5 mg/dL (ref 0.3–1.2)
Total Protein: 7.2 g/dL (ref 6.5–8.1)

## 2022-11-23 LAB — PHOSPHORUS: Phosphorus: 4.1 mg/dL (ref 2.5–4.6)

## 2022-11-23 LAB — MAGNESIUM: Magnesium: 2.3 mg/dL (ref 1.7–2.4)

## 2022-11-23 NOTE — TOC Progression Note (Addendum)
Transition of Care [facility]) - Progression Note    Patient Details  Name: Ann Green MRN: [id number] Date of Birth: [birth year]  [facility] CM/SW Contact  Tempie Hoist, [place] Phone Number: 11/23/2022, 1:35 PM  Clinical Narrative:     TOC spoke to the insurance and the SNF. The STR appeal form was not turned in. TOC spoke to the SNF and they can accept this patient with the LTC insurance policy number.  TOC is calling the patient's family about the LTC insurance policy.  TOC called the SNF business office and left a message.   TOC also is filling out a form so that the patient can attempt to transfer to SNF with [facility] Medicare auth. The patient needs a letter stating that she cannot sign her signature.   TOC has faxed the appeal form to [phone]. TOC calling [employer] for an update.  TOC has refaxed the [facility] appeal to fax number [phone].   Expected Discharge Plan and Services    LTC vs SNF rehab      Expected Discharge Date: 11/15/22                                     Social Determinants of Health (SDOH) Interventions SDOH Screenings   Food Insecurity: No Food Insecurity (11/10/2022)  Housing: Low Risk  (11/10/2022)  Transportation Needs: No Transportation Needs (11/10/2022)  Utilities: Not At Risk (11/10/2022)  Tobacco Use: Low Risk  (11/10/2022)    Readmission Risk Interventions     No data to display

## 2022-11-23 NOTE — Discharge Summary (Incomplete)
Physician Discharge Summary   Patient: Ann Green MRN: [id number] DOB: 07/01/1922  Admit date:     11/10/2022  Discharge date: 11/23/22  Discharge Physician: Marguerita Merles, DO   PCP: Tawnya Crook, MD   Recommendations at discharge:  {Tip this will not be part of the note when signed- Example include specific recommendations for outpatient follow-up, pending tests to follow-up on. (Optional):26781}  ***  Discharge Diagnoses: Principal Problem:   UTI (urinary tract infection) Active Problems:   Thyroid disease   Essential hypertension   Hyperlipidemia   Acute metabolic encephalopathy   Generalized weakness   Weakness  Resolved Problems:   * No resolved hospital problems. *  Hospital Course: TISHAWNA Green is a 88 y.o. female with dementia, hypertension, hyperlipidemia presented to hospital with generalized weakness, fatigue and tiredness with episodes of confusion and not acting herself.  Patient lives at home and has been taken care of by caregivers.  Patient was initially prescribed Macrobid by her primary care physician and had taken 1 dose this but today she developed some slurred speech and some weakness in the left arm so she was brought to the hospital.  By the time she came to the hospital her weakness and slurred speech had improved.  Patient denies any nausea, vomiting, abdominal pain, fever or chills.  Denies any chest pain, shortness of breath, dyspnea.  Denies any syncope or lightheadedness.  Denies any sick contacts or recent travel.  Denies any runny nose, sore throat.  Denies any urinary urgency, frequency or dysuria.  Initial vitals in the ED was notable for elevated blood pressure with some bradycardia.  Patient did have some left foot cellulitis.  Does have a history of bradycardia at baseline.  Labs in the ED showed hemoglobin of 9.8 no leukocytosis.  Creatinine 0.8.  UA showed some white cells from 11/10/2022 done outside.  Urine culture was sent from the ED.   Patient was started on Rocephin IV and was considered for admission to the hospital for generalized weakness, confusion, possible cellulitis.     Assessment and Plan: No notes have been filed under this hospital service. Service: Hospitalist     {Tip this will not be part of the note when signed Body mass index is 27.25 kg/m. , ,  (Optional):26781}  {(NOTE) Pain control PDMP Statment (Optional):26782} Consultants: *** Procedures performed: ***  Disposition: {Plan; Disposition:26390} Diet recommendation:  Discharge Diet Orders (From admission, onward)     Start     Ordered   11/11/22 0000  Diet general        11/11/22 0856           {Diet_Plan:26776} DISCHARGE MEDICATION: Allergies as of 11/23/2022       Reactions   Codeine Other (See Comments)   Other reaction(s): Hallucination     Med Rec must be completed prior to using this SMARTLINK***       Contact information for follow-up providers     Tawnya Crook, MD Follow up in 1 week(s).   Specialty: Internal Medicine Contact information: [address] [place] [address] [phone]         MD at SNF Follow up.               Contact information for after-discharge care     Destination     [facility] Preferred SNF .   Service: Skilled Paramedic information: [address] [place] [address] [phone]  Discharge Exam: Filed Weights   11/21/22 0256 11/22/22 0208 11/23/22 0500  Weight: 64.8 kg 62.9 kg 63.3 kg   ***  Condition at discharge: {DC Condition:26389}  The results of significant diagnostics from this hospitalization (including imaging, microbiology, ancillary and laboratory) are listed below for reference.   Imaging Studies: CT HEAD WO CONTRAST ( )  Result Date: 11/10/2022 CLINICAL DATA:  Mental status change EXAM: CT HEAD WITHOUT CONTRAST TECHNIQUE: Contiguous axial images were  obtained from the base of the skull through the vertex without intravenous contrast. RADIATION DOSE REDUCTION: This exam was performed according to the departmental dose-optimization program which includes automated exposure control, adjustment of the mA and/or kV according to patient size and/or use of iterative reconstruction technique. COMPARISON:  CT brain 05/24/2020 FINDINGS: Brain: No acute territorial infarction, hemorrhage, or intracranial mass. Moderate atrophy. Moderate severe chronic small vessel ischemic changes of the white matter. Chronic lacunar infarcts in the basal ganglia. Stable ventricle size. Vascular: No hyperdense vessels.  Carotid vascular calcification Skull: Normal. Negative for fracture or focal lesion. Sinuses/Orbits: No acute finding. Other: None IMPRESSION: 1. No CT evidence for acute intracranial abnormality. 2. Atrophy and chronic small vessel ischemic changes of the white matter. Electronically Signed   By: Jasmine Pang M.D.   On: 11/10/2022 16:00    Microbiology: Results for orders placed or performed during the hospital encounter of 11/10/22  Resp panel by RT-PCR (RSV, Flu A&B, Covid) Anterior Nasal Swab     Status: None   Collection Time: 11/10/22  4:44 PM   Specimen: Anterior Nasal Swab  Result Value Ref Range Status   SARS Coronavirus 2 by RT PCR NEGATIVE NEGATIVE Final    Comment: (NOTE) SARS-CoV-2 target nucleic acids are NOT DETECTED.  The SARS-CoV-2 RNA is generally detectable in upper respiratory specimens during the acute phase of infection. The lowest concentration of SARS-CoV-2 viral copies this assay can detect is 138 copies/mL. A negative result does not preclude SARS-Cov-2 infection and should not be used as the sole basis for treatment or other patient management decisions. A negative result may occur with  improper specimen collection/handling, submission of specimen other than nasopharyngeal swab, presence of viral mutation(s) within the areas  targeted by this assay, and inadequate number of viral copies(<138 copies/mL). A negative result must be combined with clinical observations, patient history, and epidemiological information. The expected result is Negative.  Fact Sheet for Patients:  BloggerCourse.com  Fact Sheet for Healthcare Providers:  SeriousBroker.it  This test is no t yet approved or cleared by the [place] FDA and  has been authorized for detection and/or diagnosis of SARS-CoV-2 by FDA under an Emergency Use Authorization (EUA). This EUA will remain  in effect (meaning this test can be used) for the duration of the COVID-19 declaration under Section 564(b)(1) of the Act, 21 U.S.C.section 360bbb-3(b)(1), unless the authorization is terminated  or revoked sooner.       Influenza A by PCR NEGATIVE NEGATIVE Final   Influenza B by PCR NEGATIVE NEGATIVE Final    Comment: (NOTE) The Xpert Xpress SARS-CoV-2/FLU/RSV plus assay is intended as an aid in the diagnosis of influenza from Nasopharyngeal swab specimens and should not be used as a sole basis for treatment. Nasal washings and aspirates are unacceptable for Xpert Xpress SARS-CoV-2/FLU/RSV testing.  Fact Sheet for Patients: BloggerCourse.com  Fact Sheet for Healthcare Providers: SeriousBroker.it  This test is not yet approved or cleared by the [place] FDA and has been authorized for detection and/or diagnosis of  SARS-CoV-2 by FDA under an Emergency Use Authorization (EUA). This EUA will remain in effect (meaning this test can be used) for the duration of the COVID-19 declaration under Section 564(b)(1) of the Act, 21 U.S.C. section 360bbb-3(b)(1), unless the authorization is terminated or revoked.     Resp Syncytial Virus by PCR NEGATIVE NEGATIVE Final    Comment: (NOTE) Fact Sheet for  Patients: BloggerCourse.comhttps://www.fda.gov/media/152166/download  Fact Sheet for Healthcare Providers: SeriousBroker.ithttps://www.fda.gov/media/152162/download  This test is not yet approved or cleared by the [place] FDA and has been authorized for detection and/or diagnosis of SARS-CoV-2 by FDA under an Emergency Use Authorization (EUA). This EUA will remain in effect (meaning this test can be used) for the duration of the COVID-19 declaration under Section 564(b)(1) of the Act, 21 U.S.C. section 360bbb-3(b)(1), unless the authorization is terminated or revoked.  Performed at [facility], [address] [place], [place] [address]   Urine Culture     Status: None   Collection Time: 11/13/22  7:30 AM   Specimen: Urine, Clean Catch  Result Value Ref Range Status   Specimen Description   Final    URINE, CLEAN CATCH Performed at [facility], [address] [place], [place] [address]    Special Requests   Final    NONE Performed at [facility], [address] [place], [place] [address]    Culture   Final    NO GROWTH Performed at [facility], [address] [place], [place] [address]    Report Status 11/14/2022 FINAL  Final  Resp panel by RT-PCR (RSV, Flu A&B, Covid) Anterior Nasal Swab     Status: None   Collection Time: 11/19/22 12:12 PM   Specimen: Anterior Nasal Swab  Result Value Ref Range Status   SARS Coronavirus 2 by RT PCR NEGATIVE NEGATIVE Final    Comment: (NOTE) SARS-CoV-2 target nucleic acids are NOT DETECTED.  The SARS-CoV-2 RNA is generally detectable in upper respiratory specimens during the acute phase of infection. The lowest concentration of SARS-CoV-2 viral copies this assay can detect is 138 copies/mL. A negative result does not preclude SARS-Cov-2 infection and should not be used as the sole basis for treatment or other patient management decisions. A negative result may occur with  improper specimen collection/handling,  submission of specimen other than nasopharyngeal swab, presence of viral mutation(s) within the areas targeted by this assay, and inadequate number of viral copies(<138 copies/mL). A negative result must be combined with clinical observations, patient history, and epidemiological information. The expected result is Negative.  Fact Sheet for Patients:  BloggerCourse.comhttps://www.fda.gov/media/152166/download  Fact Sheet for Healthcare Providers:  SeriousBroker.ithttps://www.fda.gov/media/152162/download  This test is no t yet approved or cleared by the [place] FDA and  has been authorized for detection and/or diagnosis of SARS-CoV-2 by FDA under an Emergency Use Authorization (EUA). This EUA will remain  in effect (meaning this test can be used) for the duration of the COVID-19 declaration under Section 564(b)(1) of the Act, 21 U.S.C.section 360bbb-3(b)(1), unless the authorization is terminated  or revoked sooner.       Influenza A by PCR NEGATIVE NEGATIVE Final   Influenza B by PCR NEGATIVE NEGATIVE Final    Comment: (NOTE) The Xpert Xpress SARS-CoV-2/FLU/RSV plus assay is intended as an aid in the diagnosis of influenza from Nasopharyngeal swab specimens and should not be used as a sole basis for treatment. Nasal washings and aspirates are unacceptable for Xpert Xpress SARS-CoV-2/FLU/RSV testing.  Fact Sheet for Patients:  BloggerCourse.com  Fact Sheet for Healthcare Providers: SeriousBroker.it  This test is not yet approved or cleared by the [place] FDA and has been authorized for detection and/or diagnosis of SARS-CoV-2 by FDA under an Emergency Use Authorization (EUA). This EUA will remain in effect (meaning this test can be used) for the duration of the COVID-19 declaration under Section 564(b)(1) of the Act, 21 U.S.C. section 360bbb-3(b)(1), unless the authorization is terminated or revoked.     Resp Syncytial Virus by PCR NEGATIVE  NEGATIVE Final    Comment: (NOTE) Fact Sheet for Patients: BloggerCourse.com  Fact Sheet for Healthcare Providers: SeriousBroker.it  This test is not yet approved or cleared by the [place] FDA and has been authorized for detection and/or diagnosis of SARS-CoV-2 by FDA under an Emergency Use Authorization (EUA). This EUA will remain in effect (meaning this test can be used) for the duration of the COVID-19 declaration under Section 564(b)(1) of the Act, 21 U.S.C. section 360bbb-3(b)(1), unless the authorization is terminated or revoked.  Performed at [facility], [address] [place], [place] [address]     Labs: CBC: Recent Labs  Lab 11/22/22 0328 11/23/22 0325  WBC 5.8 7.2  NEUTROABS 3.8 5.7  HGB 11.0* 12.0  HCT 35.2* 37.3  MCV 86.1 83.8  PLT 246 262   Basic Metabolic Panel: Recent Labs  Lab 11/18/22 0759 11/19/22 0905 11/20/22 0811 11/22/22 0328 11/23/22 0325  NA 138 140 142 141 140  K 4.3 4.2 4.3 3.8 3.8  CL 108 111 115* 112* 109  CO2 24 23 22 23 22   GLUCOSE 119* 111* 93 82 109*  BUN 44* 36* 34* 32* 26*  CREATININE 1.25* 0.99 1.03* 1.00 0.98  CALCIUM 8.9 8.4* 8.4* 9.0 9.2  MG  --   --   --  1.8 2.3  PHOS  --   --   --  4.0 4.1   Liver Function Tests: Recent Labs  Lab 11/22/22 0328 11/23/22 0325  AST 53* 51*  ALT 96* 87*  ALKPHOS 117 139*  BILITOT 0.5 0.5  PROT 6.3* 7.2  ALBUMIN 3.0* 3.5   CBG: No results for input(s): "GLUCAP" in the last 168 hours.  Discharge time spent: {LESS THAN/GREATER 11/25/22 30 minutes.  Signed: [id number]}, DO [facility] 11/23/2022

## 2022-11-23 NOTE — TOC CM/SW Note (Signed)
This patient is unable to sign her signature on any paperwork. This patient is only oriented to person. The patient is unable to sign the insurance appeal paperwork.

## 2022-11-23 NOTE — Progress Notes (Signed)
PROGRESS NOTE    Ann Green  HFG:[id number] DOB: [birth year] DOA: 11/10/2022 PCP: Tawnya Crook, MD   Brief Narrative:  Ann Green is a 88 y.o. female with dementia, hypertension, hyperlipidemia presented to hospital with generalized weakness, fatigue and tiredness with episodes of confusion and not acting herself.  Patient lives at home and has been taken care of by caregivers.  Patient was initially prescribed Macrobid by her primary care physician and had taken 1 dose this but today she developed some slurred speech and some weakness in the left arm so she was brought to the hospital.  By the time she came to the hospital her weakness and slurred speech had improved.  Patient denies any nausea, vomiting, abdominal pain, fever or chills.  Denies any chest pain, shortness of breath, dyspnea.  Denies any syncope or lightheadedness.  Denies any sick contacts or recent travel.  Denies any runny nose, sore throat.  Denies any urinary urgency, frequency or dysuria.   Initial vitals in the ED was notable for elevated blood pressure with some bradycardia.  Patient did have some left foot cellulitis.  Does have a history of bradycardia at baseline.  Labs in the ED showed hemoglobin of 9.8 no leukocytosis.  Creatinine 0.8.  UA showed some white cells from 11/10/2022 done outside.  Urine culture was sent from the ED.  Patient was started on Rocephin IV and was considered for admission to the hospital for generalized weakness, confusion, possible cellulitis.   **Interim History She started improved or hospitalization and was seen by PT and OT who recommended SNF.  She is being medically stable for discharge however her insurance authorization was denied and then appealed and the appeal for the insurance authorization is still pending and TOC stating that Insurance Form for Appeal needs to be completed by the Family.  Assessment and Plan:  Generalized weakness/Fatigue -Likely secondary to possible UTI,  dehydration. -Patient noted to have been taking care of by caretaker at home but assessment by PT recommending SNF placement at this time. -TSH, vitamin B12 low normal range.  Vitamin D levels at 53. -TOC consulted for SNF placement. -Insurance declined SNF placement, Dr. Janee Morn engaged in the peer to peer with MD at insurance company on 11/16/2022 and SNF placement declined again at that time. -Family have appealed which is in process and the appeal is still pending   Chest wall pain -Patient noted to have some chest wall pain which was reproducible on palpation, improving clinically. -EKG done with normal sinus rhythm with no ischemic changes. -Clinical improvement on Voltaren gel. -Continue Voltaren gel as needed.   Possible acute cystitis -Patient noted to have been prescribed Macrobid in the outpatient setting prior to admission. -Urine cultures done during this hospitalization with no growth. -Status post 3 days IV Rocephin. -No further antibiotics needed at this time given completed course   Acute Metabolic Encephalopathy/Mild Confusion -Felt likely secondary to infectious etiology of UTI in the setting of underlying dementia. -Status post 3 days IV Rocephin.   -Clinical improvement.  Likely at baseline given her age.     Concern for TIA with some focal weakness -Patient already noted to be on aspirin prior to admission. -Head CT done with no acute abnormalities. -Per Dr. Tyson Babinski, family did not wish to pursue any further workup including MRI as patient had improved and imaging would not likely change current management. -Continue Aspirin on discharge for secondary stroke prophylaxis.   Hypothyroidism -Continue Levothyroxine -TSH was 0.443.  History of Dementia -Per family patient noted with some bouts of confusion and some vivid dreams several nights ago. -Being cared by caregivers. -Continue Venlafaxine XR 150 mg and 75 mg Daily. -Patient placed on Zyprexa 2.5 mg  nightly and Tylenol 500 mg on12/22/2023 however per daughter patient slept well, confusion improved however patient sleeping most of the morning 11/17/2022 and as such Zyprexa discontinued and patient just maintained on Tylenol which she seems to be tolerating. -Patient seems to have tolerated Remeron overnight as needed.   -Supportive care.    Dehydration -Patient with slight bump in BUN/creatinine. -BUN/Cr Trend: Recent Labs  Lab 11/14/22 0502 11/15/22 0422 11/18/22 0759 11/19/22 0905 11/20/22 0811 11/22/22 0328 11/23/22 0325  BUN 29* 33* 44* 36* 34* 32* 26*  CREATININE 1.19* 1.03* 1.25* 0.99 1.03* 1.00 0.98  -Per family patient noted with some poor oral intake. -Status post gentle hydration with improvement so discontinued IV fluids.    Abnormal LFTs -Mild and Likely Reactive Recent Labs  Lab 11/10/22 1334 11/22/22 0328 11/23/22 0325  AST 17 53* 51*  ALT 10 96* 87*  -Repeat CMP in the AM and consider obtaining a RUQ U/S and Acute Hepatitis Panel in the AM but will hold off currently and will pursue if not improving or worsening -Repeat CMP in the AM    Normocytic Anemia -Hgb/Hct Trend: Recent Labs  Lab 11/10/22 1334 11/11/22 0512 11/14/22 0502 11/22/22 0328 11/23/22 0325  HGB 9.8* 11.0* 10.1* 11.0* 12.0  HCT 33.1* 36.2 33.2* 35.2* 37.3  MCV 89.7 86.2 86.9 86.1 83.8  -Continue to monitor for signs and symptoms of bleeding; no overt bleeding noted -Repeat CBC in a.m.   Hypoalbuminemia -Patient's Albumin is now gone from 3.0 -> 3.5 -Continue to Monitor and Trend -Repeat CMP in the AM    DVT prophylaxis: enoxaparin (LOVENOX) injection 30 mg Start: 11/12/22 2200    Code Status: DNR Family Communication: Discussed with family at bedside  Disposition Plan:  Level of care: Med-Surg Status is: Inpatient Remains inpatient appropriate because: Medically stable to D/C to SNF and awaiting Insurance Denial Appeal Decision   Consultants:  None  Procedures:  As  Delineated as above   Antimicrobials:  Anti-infectives (From admission, onward)    Start     Dose/Rate Route Frequency Ordered Stop   11/11/22 1600  cefTRIAXone (ROCEPHIN) 1 g in sodium chloride 0.9 % 100 mL IVPB  Status:  Discontinued        1 g 200 mL/hr over 30 Minutes Intravenous Every 24 hours 11/10/22 1819 11/13/22 1201   11/10/22 1700  cefTRIAXone (ROCEPHIN) 2 g in sodium chloride 0.9 % 100 mL IVPB        2 g 200 mL/hr over 30 Minutes Intravenous  Once 11/10/22 1646 11/10/22 1820       Subjective: Seen and Ament at bedside she was a little bit more sleepy today.  Family at bedside and she is still medically stable to be discharged and LFTs are slightly improved.  Awaiting insurance appeal decision.  No other concerns or complaints this time.  Objective: Vitals:   11/22/22 1954 11/23/22 0457 11/23/22 0500 11/23/22 0805  BP: (!) 165/56 (!) 156/94  (!) 188/56  Pulse: 79 83  73  Resp: 20 18  (!) 22  Temp: 98.4 F (36.9 C) 98.3 F (36.8 C)  98.3 F (36.8 C)  TempSrc:  Oral    SpO2: 100% 98%  100%  Weight:   63.3 kg   Height:  Intake/Output Summary (Last 24 hours) at 11/23/2022 0914 Last data filed at 11/23/2022 7902 Gross per 24 hour  Intake 0 ml  Output 400 ml  Net -400 ml   Filed Weights   11/21/22 0256 11/22/22 0208 11/23/22 0500  Weight: 64.8 kg 62.9 kg 63.3 kg   Examination: Physical Exam:  Constitutional: Elderly Caucasian female currently no acute distress Respiratory: Diminished to auscultation bilaterally, no wheezing, rales, rhonchi or crackles. Normal respiratory effort and patient is not tachypenic. No accessory muscle use.  Unlabored breathing Cardiovascular: RRR, no murmurs / rubs / gallops. S1 and S2 auscultated. No extremity edema. Abdomen: Soft, non-tender, mildly-distended. Bowel sounds positive.  GU: Deferred. Musculoskeletal: No clubbing / cyanosis of digits/nails. No joint deformity upper and lower extremities. Skin: No rashes,  lesions, ulcers on limited skin evaluation. No induration; Warm and dry.  Neurologic: CN 2-12 grossly intact with no focal deficits but she is now a little somnolent and drowsy. Romberg sign and cerebellar reflexes not assessed.  Psychiatric: Impaired judgment and insight.  She is also drowsy but not fully awake and alert  Data Reviewed: I have personally reviewed following labs and imaging studies  CBC: Recent Labs  Lab 11/22/22 0328 11/23/22 0325  WBC 5.8 7.2  NEUTROABS 3.8 5.7  HGB 11.0* 12.0  HCT 35.2* 37.3  MCV 86.1 83.8  PLT 246 262   Basic Metabolic Panel: Recent Labs  Lab 11/18/22 0759 11/19/22 0905 11/20/22 0811 11/22/22 0328 11/23/22 0325  NA 138 140 142 141 140  K 4.3 4.2 4.3 3.8 3.8  CL 108 111 115* 112* 109  CO2 24 23 22 23 22   GLUCOSE 119* 111* 93 82 109*  BUN 44* 36* 34* 32* 26*  CREATININE 1.25* 0.99 1.03* 1.00 0.98  CALCIUM 8.9 8.4* 8.4* 9.0 9.2  MG  --   --   --  1.8 2.3  PHOS  --   --   --  4.0 4.1   GFR: Estimated Creatinine Clearance: 26.6 mL/min (by C-G formula based on SCr of 0.98 mg/dL). Liver Function Tests: Recent Labs  Lab 11/22/22 0328 11/23/22 0325  AST 53* 51*  ALT 96* 87*  ALKPHOS 117 139*  BILITOT 0.5 0.5  PROT 6.3* 7.2  ALBUMIN 3.0* 3.5   No results for input(s): "LIPASE", "AMYLASE" in the last 168 hours. No results for input(s): "AMMONIA" in the last 168 hours. Coagulation Profile: No results for input(s): "INR", "PROTIME" in the last 168 hours. Cardiac Enzymes: No results for input(s): "CKTOTAL", "CKMB", "CKMBINDEX", "TROPONINI" in the last 168 hours. BNP (last 3 results) No results for input(s): "PROBNP" in the last 8760 hours. HbA1C: No results for input(s): "HGBA1C" in the last 72 hours. CBG: No results for input(s): "GLUCAP" in the last 168 hours. Lipid Profile: No results for input(s): "CHOL", "HDL", "LDLCALC", "TRIG", "CHOLHDL", "LDLDIRECT" in the last 72 hours. Thyroid Function Tests: No results for input(s):  "TSH", "T4TOTAL", "FREET4", "T3FREE", "THYROIDAB" in the last 72 hours. Anemia Panel: No results for input(s): "VITAMINB12", "FOLATE", "FERRITIN", "TIBC", "IRON", "RETICCTPCT" in the last 72 hours. Sepsis Labs: No results for input(s): "PROCALCITON", "LATICACIDVEN" in the last 168 hours.  Recent Results (from the past 240 hour(s))  Resp panel by RT-PCR (RSV, Flu A&B, Covid) Anterior Nasal Swab     Status: None   Collection Time: 11/19/22 12:12 PM   Specimen: Anterior Nasal Swab  Result Value Ref Range Status   SARS Coronavirus 2 by RT PCR NEGATIVE NEGATIVE Final    Comment: (NOTE) SARS-CoV-2  target nucleic acids are NOT DETECTED.  The SARS-CoV-2 RNA is generally detectable in upper respiratory specimens during the acute phase of infection. The lowest concentration of SARS-CoV-2 viral copies this assay can detect is 138 copies/mL. A negative result does not preclude SARS-Cov-2 infection and should not be used as the sole basis for treatment or other patient management decisions. A negative result may occur with  improper specimen collection/handling, submission of specimen other than nasopharyngeal swab, presence of viral mutation(s) within the areas targeted by this assay, and inadequate number of viral copies(<138 copies/mL). A negative result must be combined with clinical observations, patient history, and epidemiological information. The expected result is Negative.  Fact Sheet for Patients:  BloggerCourse.com  Fact Sheet for Healthcare Providers:  SeriousBroker.it  This test is no t yet approved or cleared by the [place] FDA and  has been authorized for detection and/or diagnosis of SARS-CoV-2 by FDA under an Emergency Use Authorization (EUA). This EUA will remain  in effect (meaning this test can be used) for the duration of the COVID-19 declaration under Section 564(b)(1) of the Act, 21 U.S.C.section 360bbb-3(b)(1),  unless the authorization is terminated  or revoked sooner.       Influenza A by PCR NEGATIVE NEGATIVE Final   Influenza B by PCR NEGATIVE NEGATIVE Final    Comment: (NOTE) The Xpert Xpress SARS-CoV-2/FLU/RSV plus assay is intended as an aid in the diagnosis of influenza from Nasopharyngeal swab specimens and should not be used as a sole basis for treatment. Nasal washings and aspirates are unacceptable for Xpert Xpress SARS-CoV-2/FLU/RSV testing.  Fact Sheet for Patients: BloggerCourse.com  Fact Sheet for Healthcare Providers: SeriousBroker.it  This test is not yet approved or cleared by the [place] FDA and has been authorized for detection and/or diagnosis of SARS-CoV-2 by FDA under an Emergency Use Authorization (EUA). This EUA will remain in effect (meaning this test can be used) for the duration of the COVID-19 declaration under Section 564(b)(1) of the Act, 21 U.S.C. section 360bbb-3(b)(1), unless the authorization is terminated or revoked.     Resp Syncytial Virus by PCR NEGATIVE NEGATIVE Final    Comment: (NOTE) Fact Sheet for Patients: BloggerCourse.com  Fact Sheet for Healthcare Providers: SeriousBroker.it  This test is not yet approved or cleared by the [place] FDA and has been authorized for detection and/or diagnosis of SARS-CoV-2 by FDA under an Emergency Use Authorization (EUA). This EUA will remain in effect (meaning this test can be used) for the duration of the COVID-19 declaration under Section 564(b)(1) of the Act, 21 U.S.C. section 360bbb-3(b)(1), unless the authorization is terminated or revoked.  Performed at [facility], [address], [place], [place] [address]     Radiology Studies: No results found.  Scheduled Meds:  aspirin  81 mg Oral Daily   cholecalciferol  1,000 Units Oral Daily   docusate sodium  100 mg Oral  BID   enoxaparin (LOVENOX) injection  30 mg Subcutaneous Q24H   ferrous sulfate  325 mg Oral Daily   fluticasone  1 spray Each Nare Daily   levothyroxine  75 mcg Oral Q0600   loratadine  10 mg Oral Daily   OLANZapine  2.5 mg Oral Daily   pantoprazole  40 mg Oral Daily   venlafaxine XR  150 mg Oral Daily   venlafaxine XR  75 mg Oral Daily   Continuous Infusions:   LOS: 11 days   Marguerita Merles, DO [facility] Available via Epic secure chat 7am-7pm  After these hours, please refer to coverage provider listed on [email or web address] 11/23/2022, 9:14 AM

## 2022-11-24 ENCOUNTER — Inpatient Hospital Stay: Payer: Medicare Other

## 2022-11-24 DIAGNOSIS — G9341 Metabolic encephalopathy: Secondary | ICD-10-CM | POA: Diagnosis not present

## 2022-11-24 DIAGNOSIS — I10 Essential (primary) hypertension: Secondary | ICD-10-CM | POA: Diagnosis not present

## 2022-11-24 DIAGNOSIS — R7989 Other specified abnormal findings of blood chemistry: Secondary | ICD-10-CM

## 2022-11-24 DIAGNOSIS — N390 Urinary tract infection, site not specified: Secondary | ICD-10-CM | POA: Diagnosis not present

## 2022-11-24 DIAGNOSIS — R531 Weakness: Secondary | ICD-10-CM | POA: Diagnosis not present

## 2022-11-24 LAB — CBC WITH DIFFERENTIAL/PLATELET
Abs Immature Granulocytes: 0.07 10*3/uL (ref 0.00–0.07)
Basophils Absolute: 0 10*3/uL (ref 0.0–0.1)
Basophils Relative: 1 %
Eosinophils Absolute: 0.1 10*3/uL (ref 0.0–0.5)
Eosinophils Relative: 2 %
HCT: 38.3 % (ref 36.0–46.0)
Hemoglobin: 12.1 g/dL (ref 12.0–15.0)
Immature Granulocytes: 1 %
Lymphocytes Relative: 21 %
Lymphs Abs: 1.1 10*3/uL (ref 0.7–4.0)
MCH: 27 pg (ref 26.0–34.0)
MCHC: 31.6 g/dL (ref 30.0–36.0)
MCV: 85.5 fL (ref 80.0–100.0)
Monocytes Absolute: 0.9 10*3/uL (ref 0.1–1.0)
Monocytes Relative: 17 %
Neutro Abs: 3.1 10*3/uL (ref 1.7–7.7)
Neutrophils Relative %: 58 %
Platelets: 254 10*3/uL (ref 150–400)
RBC: 4.48 MIL/uL (ref 3.87–5.11)
RDW: 19.8 % — ABNORMAL HIGH (ref 11.5–15.5)
WBC: 5.3 10*3/uL (ref 4.0–10.5)
nRBC: 0 % (ref 0.0–0.2)

## 2022-11-24 LAB — COMPREHENSIVE METABOLIC PANEL
ALT: 128 U/L — ABNORMAL HIGH (ref 0–44)
AST: 105 U/L — ABNORMAL HIGH (ref 15–41)
Albumin: 3.1 g/dL — ABNORMAL LOW (ref 3.5–5.0)
Alkaline Phosphatase: 147 U/L — ABNORMAL HIGH (ref 38–126)
Anion gap: 8 (ref 5–15)
BUN: 38 mg/dL — ABNORMAL HIGH (ref 8–23)
CO2: 22 mmol/L (ref 22–32)
Calcium: 8.5 mg/dL — ABNORMAL LOW (ref 8.9–10.3)
Chloride: 109 mmol/L (ref 98–111)
Creatinine, Ser: 1.21 mg/dL — ABNORMAL HIGH (ref 0.44–1.00)
GFR, Estimated: 41 mL/min — ABNORMAL LOW (ref >60)
Glucose, Bld: 97 mg/dL (ref 70–99)
Potassium: 4 mmol/L (ref 3.5–5.1)
Sodium: 139 mmol/L (ref 135–145)
Total Bilirubin: 0.6 mg/dL (ref 0.3–1.2)
Total Protein: 6.7 g/dL (ref 6.5–8.1)

## 2022-11-24 LAB — MAGNESIUM: Magnesium: 2.1 mg/dL (ref 1.7–2.4)

## 2022-11-24 LAB — PHOSPHORUS: Phosphorus: 4.1 mg/dL (ref 2.5–4.6)

## 2022-11-24 MED ORDER — SODIUM CHLORIDE 0.9 % IV SOLN: INTRAVENOUS | Status: AC

## 2022-11-24 NOTE — Progress Notes (Signed)
PROGRESS NOTE    [name]  YJE:[id number] DOB: July 26, 1922 DOA: 11/10/2022 PCP: Ann Crook, MD   Brief Narrative:  Ann Green is a 88 y.o. female with dementia, hypertension, hyperlipidemia presented to hospital with generalized weakness, fatigue and tiredness with episodes of confusion and not acting herself.  Patient lives at home and has been taken care of by caregivers.  Patient was initially prescribed Macrobid by her primary care physician and had taken 1 dose this but today she developed some slurred speech and some weakness in the left arm so she was brought to the hospital.  By the time she came to the hospital her weakness and slurred speech had improved.  Patient denies any nausea, vomiting, abdominal pain, fever or chills.  Denies any chest pain, shortness of breath, dyspnea.  Denies any syncope or lightheadedness.  Denies any sick contacts or recent travel.  Denies any runny nose, sore throat.  Denies any urinary urgency, frequency or dysuria.   Initial vitals in the ED was notable for elevated blood pressure with some bradycardia.  Patient did have some left foot cellulitis.  Does have a history of bradycardia at baseline.  Labs in the ED showed hemoglobin of 9.8 no leukocytosis.  Creatinine 0.8.  UA showed some white cells from 11/10/2022 done outside.  Urine culture was sent from the ED.  Patient was started on Rocephin IV and was considered for admission to the hospital for generalized weakness, confusion, possible cellulitis.   **Interim History She started improved or hospitalization and was seen by PT and OT who recommended SNF.  She is being medically stable for discharge however her insurance authorization was denied and then appealed and the appeal for the insurance authorization is still pending and TOC stating that Insurance Form for Appeal needs to be completed by the Family. Now Appeal is canceled and awaiting SNF to pursue LTC coverage.   Assessment and  Plan:  Generalized weakness/Fatigue -Likely secondary to possible UTI, dehydration. -Patient noted to have been taking care of by caretaker at home but assessment by PT recommending SNF placement at this time. -TSH, vitamin B12 low normal range.  Vitamin D levels at 53. -TOC consulted for SNF placement. -Insurance declined SNF placement, Dr. Janee Morn engaged in the peer to peer with MD at insurance company on 11/16/2022 and SNF placement declined again at that time. -Family have appealed which is in process and the appeal is still pending and per my discussion with the Son may not be until Tuesday 11/27/22   Chest wall pain -Patient noted to have some chest wall pain which was reproducible on palpation, improving clinically. -EKG done with normal sinus rhythm with no ischemic changes. -Clinical improvement on Voltaren gel. -Continue Voltaren gel as needed.   Possible Acute Cystitis -Patient noted to have been prescribed Macrobid in the outpatient setting prior to admission. -Urine cultures done during this hospitalization with no growth. -Status post 3 days IV Rocephin. -No further antibiotics needed at this time given completed course   Acute Metabolic Encephalopathy/Mild Confusion -Felt likely secondary to infectious etiology of UTI in the setting of underlying dementia. -Status post 3 days IV Rocephin.   -Clinical improvement.  Likely at baseline given her age.     Concern for TIA with some focal weakness -Patient already noted to be on aspirin prior to admission. -Head CT done with no acute abnormalities. -Per Dr. Tyson Babinski, family did not wish to pursue any further workup including MRI as patient had improved  and imaging would not likely change current management. -Continue Aspirin on discharge for secondary stroke prophylaxis.   Hypothyroidism -Continue Levothyroxine -TSH was 0.443.    History of Dementia -Per family patient noted with some bouts of confusion and some vivid  dreams several nights ago. -Being cared by caregivers. -Continue Venlafaxine XR 150 mg and 75 mg Daily. -Patient placed on Zyprexa 2.5 mg nightly and Tylenol 500 mg on12/22/2023 however per daughter patient slept well, confusion improved however patient sleeping most of the morning 11/17/2022 and as such Zyprexa discontinued and patient just maintained on Tylenol which she seems to be tolerating. -Patient seems to have tolerated Remeron overnight as needed.   -C/w Supportive care.    Dehydration AKI on CKD Stage 3a -Patient with slight bump in BUN/creatinine. -BUN/Cr Trend: Recent Labs  Lab 11/15/22 0422 11/18/22 0759 11/19/22 0905 11/20/22 0811 11/22/22 0328 11/23/22 0325 11/24/22 0537  BUN 33* 44* 36* 34* 32* 26* 38*  CREATININE 1.03* 1.25* 0.99 1.03* 1.00 0.98 1.21*  -Start NS at 75 mL/hr -Avoid Nephrotoxic Medications, Contrast Dyes, Hypotension and Dehydration to Ensure Adequate Renal Perfusion and will need to Renally Adjust Meds -Continue to Monitor and Trend Renal Function carefully and repeat CMP in the AM   -Per family patient noted with some poor oral intake. -Status post gentle hydration with improvement so discontinued IV fluids.    Abnormal LFTs -Mild and Likely Reactive Recent Labs  Lab 11/10/22 1334 11/22/22 0328 11/23/22 0325 11/24/22 0537  AST 17 53* 51* 105*  ALT 10 96* 87* 128*  -Repeat CMP in the AM and since her LFTs started worsening we will obtain a right upper quadrant ultrasound and showed "Gallbladder is not seen. This may suggest previous cholecystectomy or contracted gallbladder obscured by bowel gas. Proximal common bile duct is slightly prominent measuring 6.1 mm. There is no dilation of intrahepatic bile ducts." -Repeat CMP in the AM    Normocytic Anemia -Hgb/Hct Trend: Recent Labs  Lab 11/10/22 1334 11/11/22 0512 11/14/22 0502 11/22/22 0328 11/23/22 0325 11/24/22 0537  HGB 9.8* 11.0* 10.1* 11.0* 12.0 12.1  HCT 33.1* 36.2 33.2*  35.2* 37.3 38.3  MCV 89.7 86.2 86.9 86.1 83.8 85.5  -Continue to monitor for signs and symptoms of bleeding; no overt bleeding noted -Repeat CBC in a.m.   Hypoalbuminemia -Patient's Albumin is now gone from 3.0 -> 3.5 -> 3.1 -Continue to Monitor and Trend -Repeat CMP in the AM    DVT prophylaxis: enoxaparin (LOVENOX) injection 30 mg Start: 11/12/22 2200    Code Status: DNR Family Communication: Discussed with Son at bedside   Disposition Plan:  Level of care: Med-Surg Status is: Inpatient Remains inpatient appropriate because: Awaiting SNF coverage as she is medically stable   Consultants:  As delineated as above  Procedures:  As above  Antimicrobials:  Anti-infectives (From admission, onward)    Start     Dose/Rate Route Frequency Ordered Stop   11/11/22 1600  cefTRIAXone (ROCEPHIN) 1 g in sodium chloride 0.9 % 100 mL IVPB  Status:  Discontinued        1 g 200 mL/hr over 30 Minutes Intravenous Every 24 hours 11/10/22 1819 11/13/22 1201   11/10/22 1700  cefTRIAXone (ROCEPHIN) 2 g in sodium chloride 0.9 % 100 mL IVPB        2 g 200 mL/hr over 30 Minutes Intravenous  Once 11/10/22 1646 11/10/22 1820       Subjective: Seen and examined at bedside and she is a little agitated and  son states that she is agitated all afternoon.  Appears little withdrawn.  Denies any complaints of pain.  No nausea or vomiting.  No other concerns or complaints at this time.  Objective: Vitals:   11/24/22 0525 11/24/22 0539 11/24/22 0913 11/24/22 0913  BP: (!) 114/54  (!) 110/52 (!) 110/52  Pulse: 73   73  Resp: 16  16 16   Temp: 98.3 F (36.8 C)  97.8 F (36.6 C) 97.8 F (36.6 C)  TempSrc:      SpO2: 95%   94%  Weight:  59.4 kg    Height:        Intake/Output Summary (Last 24 hours) at 11/24/2022 1757 Last data filed at 11/24/2022 1500 Gross per 24 hour  Intake 457.34 ml  Output --  Net 457.34 ml   Filed Weights   11/22/22 0208 11/23/22 0500 11/24/22 0539  Weight: 62.9 kg  63.3 kg 59.4 kg   Examination: Physical Exam:  Constitutional: Elderly Caucasian female currently no acute distress Respiratory: Diminished to auscultation bilaterally, no wheezing, rales, rhonchi or crackles. Normal respiratory effort and patient is not tachypenic. No accessory muscle use.  Unlabored breathing Cardiovascular: RRR, no murmurs / rubs / gallops. S1 and S2 auscultated. No extremity edema.  Abdomen: Soft, non-tender, slightly distended.  Bowel sounds present  GU: Deferred. Musculoskeletal: No clubbing / cyanosis of digits/nails. No joint deformity upper and lower extremities. Skin: No rashes, lesions, ulcers on limited skin evaluation. No induration; Warm and dry.  Neurologic: CN 2-12 grossly intact with no focal deficits. Romberg sign and cerebellar reflexes not assessed.  Psychiatric: Impaired judgment and insight.  She is awake and alert but not fully oriented.  Slightly agitated  Data Reviewed: I have personally reviewed following labs and imaging studies  CBC: Recent Labs  Lab 11/22/22 0328 11/23/22 0325 11/24/22 0537  WBC 5.8 7.2 5.3  NEUTROABS 3.8 5.7 3.1  HGB 11.0* 12.0 12.1  HCT 35.2* 37.3 38.3  MCV 86.1 83.8 85.5  PLT 246 262 254   Basic Metabolic Panel: Recent Labs  Lab 11/19/22 0905 11/20/22 0811 11/22/22 0328 11/23/22 0325 11/24/22 0537  NA 140 142 141 140 139  K 4.2 4.3 3.8 3.8 4.0  CL 111 115* 112* 109 109  CO2 23 22 23 22 22   GLUCOSE 111* 93 82 109* 97  BUN 36* 34* 32* 26* 38*  CREATININE 0.99 1.03* 1.00 0.98 1.21*  CALCIUM 8.4* 8.4* 9.0 9.2 8.5*  MG  --   --  1.8 2.3 2.1  PHOS  --   --  4.0 4.1 4.1   GFR: Estimated Creatinine Clearance: 20.9 mL/min (A) (by C-G formula based on SCr of 1.21 mg/dL (H)). Liver Function Tests: Recent Labs  Lab 11/22/22 0328 11/23/22 0325 11/24/22 0537  AST 53* 51* 105*  ALT 96* 87* 128*  ALKPHOS 117 139* 147*  BILITOT 0.5 0.5 0.6  PROT 6.3* 7.2 6.7  ALBUMIN 3.0* 3.5 3.1*   No results for  input(s): "LIPASE", "AMYLASE" in the last 168 hours. No results for input(s): "AMMONIA" in the last 168 hours. Coagulation Profile: No results for input(s): "INR", "PROTIME" in the last 168 hours. Cardiac Enzymes: No results for input(s): "CKTOTAL", "CKMB", "CKMBINDEX", "TROPONINI" in the last 168 hours. BNP (last 3 results) No results for input(s): "PROBNP" in the last 8760 hours. HbA1C: No results for input(s): "HGBA1C" in the last 72 hours. CBG: No results for input(s): "GLUCAP" in the last 168 hours. Lipid Profile: No results for input(s): "CHOL", "HDL", "  LDLCALC", "TRIG", "CHOLHDL", "LDLDIRECT" in the last 72 hours. Thyroid Function Tests: No results for input(s): "TSH", "T4TOTAL", "FREET4", "T3FREE", "THYROIDAB" in the last 72 hours. Anemia Panel: No results for input(s): "VITAMINB12", "FOLATE", "FERRITIN", "TIBC", "IRON", "RETICCTPCT" in the last 72 hours. Sepsis Labs: No results for input(s): "PROCALCITON", "LATICACIDVEN" in the last 168 hours.  Recent Results (from the past 240 hour(s))  Resp panel by RT-PCR (RSV, Flu A&B, Covid) Anterior Nasal Swab     Status: None   Collection Time: 11/19/22 12:12 PM   Specimen: Anterior Nasal Swab  Result Value Ref Range Status   SARS Coronavirus 2 by RT PCR NEGATIVE NEGATIVE Final    Comment: (NOTE) SARS-CoV-2 target nucleic acids are NOT DETECTED.  The SARS-CoV-2 RNA is generally detectable in upper respiratory specimens during the acute phase of infection. The lowest concentration of SARS-CoV-2 viral copies this assay can detect is 138 copies/mL. A negative result does not preclude SARS-Cov-2 infection and should not be used as the sole basis for treatment or other patient management decisions. A negative result may occur with  improper specimen collection/handling, submission of specimen other than nasopharyngeal swab, presence of viral mutation(s) within the areas targeted by this assay, and inadequate number of  viral copies(<138 copies/mL). A negative result must be combined with clinical observations, patient history, and epidemiological information. The expected result is Negative.  Fact Sheet for Patients:  BloggerCourse.com  Fact Sheet for Healthcare Providers:  SeriousBroker.it  This test is no t yet approved or cleared by the [place] FDA and  has been authorized for detection and/or diagnosis of SARS-CoV-2 by FDA under an Emergency Use Authorization (EUA). This EUA will remain  in effect (meaning this test can be used) for the duration of the COVID-19 declaration under Section 564(b)(1) of the Act, 21 U.S.C.section 360bbb-3(b)(1), unless the authorization is terminated  or revoked sooner.       Influenza A by PCR NEGATIVE NEGATIVE Final   Influenza B by PCR NEGATIVE NEGATIVE Final    Comment: (NOTE) The Xpert Xpress SARS-CoV-2/FLU/RSV plus assay is intended as an aid in the diagnosis of influenza from Nasopharyngeal swab specimens and should not be used as a sole basis for treatment. Nasal washings and aspirates are unacceptable for Xpert Xpress SARS-CoV-2/FLU/RSV testing.  Fact Sheet for Patients: BloggerCourse.com  Fact Sheet for Healthcare Providers: SeriousBroker.it  This test is not yet approved or cleared by the [place] FDA and has been authorized for detection and/or diagnosis of SARS-CoV-2 by FDA under an Emergency Use Authorization (EUA). This EUA will remain in effect (meaning this test can be used) for the duration of the COVID-19 declaration under Section 564(b)(1) of the Act, 21 U.S.C. section 360bbb-3(b)(1), unless the authorization is terminated or revoked.     Resp Syncytial Virus by PCR NEGATIVE NEGATIVE Final    Comment: (NOTE) Fact Sheet for Patients: BloggerCourse.com  Fact Sheet for Healthcare  Providers: SeriousBroker.it  This test is not yet approved or cleared by the [place] FDA and has been authorized for detection and/or diagnosis of SARS-CoV-2 by FDA under an Emergency Use Authorization (EUA). This EUA will remain in effect (meaning this test can be used) for the duration of the COVID-19 declaration under Section 564(b)(1) of the Act, 21 U.S.C. section 360bbb-3(b)(1), unless the authorization is terminated or revoked.  Performed at [facility], [address], [place], [place] [address]     Radiology Studies: US Abdomen Limited RUQ (LIVER/GB)  Result Date: 11/24/2022 CLINICAL DATA:  Abnormal liver function tests twenty-two EXAM: ULTRASOUND ABDOMEN LIMITED RIGHT UPPER QUADRANT COMPARISON:  None Available. FINDINGS: Gallbladder: Gallbladder is not seen. Common bile duct: Diameter: 6.1 mm Liver: No focal abnormalities are seen in the visualized portions of liver. Left lobe of liver is not adequately visualized. Portal vein is patent on color Doppler imaging with normal direction of blood flow towards the liver. Other: None. IMPRESSION: Gallbladder is not seen. This may suggest previous cholecystectomy or contracted gallbladder obscured by bowel gas. Proximal common bile duct is slightly prominent measuring 6.1 mm. There is no dilation of intrahepatic bile ducts. Electronically Signed   By: Ernie Avena M.D.   On: 11/24/2022 13:14    Scheduled Meds:  aspirin  81 mg Oral Daily   cholecalciferol  1,000 Units Oral Daily   docusate sodium  100 mg Oral BID   enoxaparin (LOVENOX) injection  30 mg Subcutaneous Q24H   ferrous sulfate  325 mg Oral Daily   fluticasone  1 spray Each Nare Daily   levothyroxine  75 mcg Oral Q0600   loratadine  10 mg Oral Daily   OLANZapine  2.5 mg Oral Daily   pantoprazole  40 mg Oral Daily   venlafaxine XR  150 mg Oral Daily   venlafaxine XR  75 mg Oral Daily   Continuous Infusions:  sodium chloride  75 mL/hr at 11/24/22 0853    LOS: 12 days   Marguerita Merles, DO [facility] Available via Epic secure chat 7am-7pm After these hours, please refer to coverage provider listed on [email or web address] 11/24/2022, 5:57 PM

## 2022-11-24 NOTE — Progress Notes (Signed)
Physical Therapy Treatment Patient Details Name: Ann Green MRN: [id number] DOB: Feb 09, 1922 Today's Date: 11/24/2022   History of Present Illness 88 y/o female presented to ED on 11/10/22 for AMS and UTI. Admitted for possible acute cystitis and L LE cellulitis. PMH: dementia, HTN, HLD    PT Comments    Pt seen for PT tx with pt agreeable. Pt received in bed, very flexed & attempting to eat lunch with eyes closed majority of time with son present. PT educated/encouraged pt to transfer OOB to recliner to increase alertness & upright posture to allow pt to eat safely. Pt requires max assist for bed mobility, max assist for STS & step pivot with RW. Pt demonstrates posterior lean/rigidity & very poor ability to anterior weight shift despite cuing & PT attempting to facilitate to increase ease of transfer. Pt also incontinent of urine during transfer & appears unaware. Pt more alert once in recliner & set up with lunch. Continue to recommend STR upon d/c from acute setting.    Recommendations for follow up therapy are one component of a multi-disciplinary discharge planning process, led by the attending physician.  Recommendations may be updated based on patient status, additional functional criteria and insurance authorization.  Follow Up Recommendations  Skilled nursing-short term rehab (<3 hours/day) Can patient physically be transported by private vehicle: No   Assistance Recommended at Discharge Frequent or constant Supervision/Assistance  Patient can return home with the following Direct supervision/assist for financial management;Help with stairs or ramp for entrance;Direct supervision/assist for medications management;Assistance with cooking/housework;Assist for transportation;A lot of help with bathing/dressing/bathroom;A lot of help with walking and/or transfers   Equipment Recommendations  Rolling walker (2 wheels)    Recommendations for Other Services       Precautions /  Restrictions Precautions Precautions: Fall Restrictions Weight Bearing Restrictions: No     Mobility  Bed Mobility Overal bed mobility: Needs Assistance Bed Mobility: Supine to Sit     Supine to sit: Max assist, HOB elevated     General bed mobility comments: assistance to move BLE to EOB, pt holds to PTs hands & participates in uprighting trunk but requires max assist overall. Pt is able to scoot to EOB with min assist, extra time & cuing to do so.    Transfers Overall transfer level: Needs assistance Equipment used: Rolling walker (2 wheels) Transfers: Sit to/from Stand Sit to Stand: Max assist   Step pivot transfers: Max assist       General transfer comment: Pt requires cuing & facilitation for anterior weight shifting but still very poor ability to do so. PT blocks BLE feet from sliding out from under her. Pt requires max assist for STS & for balance during step pivot.    Ambulation/Gait                   Stairs             Wheelchair Mobility    Modified Rankin (Stroke Patients Only)       Balance Overall balance assessment: History of Falls, Needs assistance Sitting-balance support: Feet supported Sitting balance-Leahy Scale: Fair Sitting balance - Comments: supervision static sitting with posterior bias. Postural control: Posterior lean Standing balance support: Bilateral upper extremity supported, Reliant on assistive device for balance, During functional activity Standing balance-Leahy Scale: Poor Standing balance comment: significant posterior lean despite cuing/PT attempting to facilitate anterior weight shift  Cognition Arousal/Alertness: Awake/alert Behavior During Therapy: Flat affect Overall Cognitive Status: History of cognitive impairments - at baseline                                 General Comments: Follows simple commands with extra time.        Exercises       General Comments General comments (skin integrity, edema, etc.): Pt with urinary incontinence despite wearing purewick throughout session, PT assists with doffing soiled socks & mesh underwear - notified nursing staff of pt's need for new, clean ones.      Pertinent Vitals/Pain Pain Assessment Pain Assessment: Faces Faces Pain Scale: No hurt    Home Living                          Prior Function            PT Goals (current goals can now be found in the care plan section) Acute Rehab PT Goals Patient Stated Goal: did not state PT Goal Formulation: With patient Time For Goal Achievement: 12/08/22 Potential to Achieve Goals: Fair Progress towards PT goals: Goals downgraded-see care plan    Frequency    Min 2X/week      PT Plan Current plan remains appropriate    Co-evaluation              AM-PAC PT "6 Clicks" Mobility   Outcome Measure  Help needed turning from your back to your side while in a flat bed without using bedrails?: A Lot Help needed moving from lying on your back to sitting on the side of a flat bed without using bedrails?: A Lot Help needed moving to and from a bed to a chair (including a wheelchair)?: A Lot Help needed standing up from a chair using your arms (e.g., wheelchair or bedside chair)?: A Lot Help needed to walk in hospital room?: A Lot Help needed climbing 3-5 steps with a railing? : Total 6 Click Score: 11    End of Session   Activity Tolerance: Patient tolerated treatment well Patient left: in chair;with call bell/phone within reach;with family/visitor present Nurse Communication: Mobility status PT Visit Diagnosis: Unsteadiness on feet (R26.81);History of falling (Z91.81);Repeated falls (R29.6);Muscle weakness (generalized) (M62.81);Difficulty in walking, not elsewhere classified (R26.2)     Time: 8889-1694 PT Time Calculation (min) (ACUTE ONLY): 10 min  Charges:  $Therapeutic Activity: 8-22 mins                      Aleda Grana, PT, DPT 11/24/22, 2:36 PM   Sandi Mariscal 11/24/2022, 2:34 PM

## 2022-11-25 DIAGNOSIS — I10 Essential (primary) hypertension: Secondary | ICD-10-CM | POA: Diagnosis not present

## 2022-11-25 DIAGNOSIS — G9341 Metabolic encephalopathy: Secondary | ICD-10-CM | POA: Diagnosis not present

## 2022-11-25 DIAGNOSIS — R531 Weakness: Secondary | ICD-10-CM | POA: Diagnosis not present

## 2022-11-25 DIAGNOSIS — N390 Urinary tract infection, site not specified: Secondary | ICD-10-CM | POA: Diagnosis not present

## 2022-11-25 LAB — CBC WITH DIFFERENTIAL/PLATELET
Abs Immature Granulocytes: 0.03 10*3/uL (ref 0.00–0.07)
Basophils Absolute: 0 10*3/uL (ref 0.0–0.1)
Basophils Relative: 1 %
Eosinophils Absolute: 0.1 10*3/uL (ref 0.0–0.5)
Eosinophils Relative: 2 %
HCT: 34.8 % — ABNORMAL LOW (ref 36.0–46.0)
Hemoglobin: 10.8 g/dL — ABNORMAL LOW (ref 12.0–15.0)
Immature Granulocytes: 1 %
Lymphocytes Relative: 21 %
Lymphs Abs: 1.2 10*3/uL (ref 0.7–4.0)
MCH: 26.9 pg (ref 26.0–34.0)
MCHC: 31 g/dL (ref 30.0–36.0)
MCV: 86.6 fL (ref 80.0–100.0)
Monocytes Absolute: 0.7 10*3/uL (ref 0.1–1.0)
Monocytes Relative: 13 %
Neutro Abs: 3.6 10*3/uL (ref 1.7–7.7)
Neutrophils Relative %: 62 %
Platelets: 226 10*3/uL (ref 150–400)
RBC: 4.02 MIL/uL (ref 3.87–5.11)
RDW: 19.5 % — ABNORMAL HIGH (ref 11.5–15.5)
WBC: 5.7 10*3/uL (ref 4.0–10.5)
nRBC: 0 % (ref 0.0–0.2)

## 2022-11-25 LAB — COMPREHENSIVE METABOLIC PANEL
ALT: 92 U/L — ABNORMAL HIGH (ref 0–44)
AST: 58 U/L — ABNORMAL HIGH (ref 15–41)
Albumin: 2.7 g/dL — ABNORMAL LOW (ref 3.5–5.0)
Alkaline Phosphatase: 129 U/L — ABNORMAL HIGH (ref 38–126)
Anion gap: 6 (ref 5–15)
BUN: 40 mg/dL — ABNORMAL HIGH (ref 8–23)
CO2: 21 mmol/L — ABNORMAL LOW (ref 22–32)
Calcium: 8.2 mg/dL — ABNORMAL LOW (ref 8.9–10.3)
Chloride: 116 mmol/L — ABNORMAL HIGH (ref 98–111)
Creatinine, Ser: 1.04 mg/dL — ABNORMAL HIGH (ref 0.44–1.00)
GFR, Estimated: 49 mL/min — ABNORMAL LOW (ref >60)
Glucose, Bld: 86 mg/dL (ref 70–99)
Potassium: 3.7 mmol/L (ref 3.5–5.1)
Sodium: 143 mmol/L (ref 135–145)
Total Bilirubin: 0.6 mg/dL (ref 0.3–1.2)
Total Protein: 5.9 g/dL — ABNORMAL LOW (ref 6.5–8.1)

## 2022-11-25 LAB — PHOSPHORUS: Phosphorus: 3.2 mg/dL (ref 2.5–4.6)

## 2022-11-25 LAB — MAGNESIUM: Magnesium: 1.8 mg/dL (ref 1.7–2.4)

## 2022-11-25 MED ORDER — MAGNESIUM SULFATE 2 GM/50ML IV SOLN
2.0000 g | Freq: Once | INTRAVENOUS | Status: AC
  Administered 2022-11-25: 2 g via INTRAVENOUS
  Filled 2022-11-25: qty 50

## 2022-11-25 NOTE — Progress Notes (Signed)
PROGRESS NOTE    Ann Green  XFG:[id number] DOB: 1922/11/22 DOA: 11/10/2022 PCP: Tawnya Crook, MD   Brief Narrative:  Ann Green is a 88 y.o. female with dementia, hypertension, hyperlipidemia presented to hospital with generalized weakness, fatigue and tiredness with episodes of confusion and not acting herself.  Patient lives at home and has been taken care of by caregivers.  Patient was initially prescribed Macrobid by her primary care physician and had taken 1 dose this but today she developed some slurred speech and some weakness in the left arm so she was brought to the hospital.  By the time she came to the hospital her weakness and slurred speech had improved.  Patient denies any nausea, vomiting, abdominal pain, fever or chills.  Denies any chest pain, shortness of breath, dyspnea.  Denies any syncope or lightheadedness.  Denies any sick contacts or recent travel.  Denies any runny nose, sore throat.  Denies any urinary urgency, frequency or dysuria.   Initial vitals in the ED was notable for elevated blood pressure with some bradycardia.  Patient did have some left foot cellulitis.  Does have a history of bradycardia at baseline.  Labs in the ED showed hemoglobin of 9.8 no leukocytosis.  Creatinine 0.8.  UA showed some white cells from 11/10/2022 done outside.  Urine culture was sent from the ED.  Patient was started on Rocephin IV and was considered for admission to the hospital for generalized weakness, confusion, possible cellulitis.   **Interim History She started improved or hospitalization and was seen by PT and OT who recommended SNF.  She is being medically stable for discharge however her insurance authorization was denied and then appealed and the appeal for the insurance authorization is still pending and TOC stating that Insurance Form for Appeal needs to be completed by the Family. Now Appeal is canceled and awaiting SNF to pursue LTC coverage.   Assessment and  Plan:  Generalized weakness/Fatigue -Likely secondary to possible UTI, dehydration. -Patient noted to have been taking care of by caretaker at home but assessment by PT recommending SNF placement at this time. -TSH, vitamin B12 low normal range.  Vitamin D levels at 53. -TOC consulted for SNF placement. -Insurance declined SNF placement, Dr. Janee Morn engaged in the peer to peer with MD at insurance company on 11/16/2022 and SNF placement declined again at that time. -Family have appealed which is in process and the appeal is still pending and per my discussion with the Son may not be until Tuesday 11/27/22 as they are trying for LTC Coverage now   Chest wall pain -Patient noted to have some chest wall pain which was reproducible on palpation, improving clinically. -EKG done with normal sinus rhythm with no ischemic changes. -Clinical improvement on Voltaren gel. -Continue Voltaren gel as needed.   Possible Acute Cystitis -Patient noted to have been prescribed Macrobid in the outpatient setting prior to admission. -Urine cultures done during this hospitalization with no growth. -Status post 3 days IV Rocephin. -No further antibiotics needed at this time given completed course   Acute Metabolic Encephalopathy/Mild Confusion, improving  -Felt likely secondary to infectious etiology of UTI in the setting of underlying dementia. -Status post 3 days IV Rocephin.   -Clinical improvement.  Likely at baseline given her age.   -11/25/2022 she was awake and alert and laughing and making jokes with her family   Concern for TIA with some focal weakness -Patient already noted to be on aspirin prior to admission. -Head  CT done with no acute abnormalities. -Per Dr. Tyson Babinski, family did not wish to pursue any further workup including MRI as patient had improved and imaging would not likely change current management. -Continue Aspirin on discharge for secondary stroke prophylaxis.    Hypothyroidism -Continue Levothyroxine -TSH was 0.443.    History of Dementia -Per family patient noted with some bouts of confusion and some vivid dreams several nights ago. -Being cared by caregivers. -Continue Venlafaxine XR 150 mg and 75 mg Daily. -Patient placed on Zyprexa 2.5 mg nightly and Tylenol 500 mg on12/22/2023 however per daughter patient slept well, confusion improved however patient sleeping most of the morning 11/17/2022 and as such Zyprexa discontinued and patient just maintained on Tylenol which she seems to be tolerating. -Patient seems to have tolerated Remeron overnight as needed.   -C/w Supportive care.    Dehydration AKI on CKD Stage 3a Metabolic Acidosis  -Patient with slight bump in BUN/creatinine. -BUN/Cr Trend: Recent Labs  Lab 11/18/22 0759 11/19/22 0905 11/20/22 0811 11/22/22 0328 11/23/22 0325 11/24/22 0537 11/25/22 0548  BUN 44* 36* 34* 32* 26* 38* 40*  CREATININE 1.25* 0.99 1.03* 1.00 0.98 1.21* 1.04*  -Started NS at 75 mL/hr and will continue for another 12 hours -Patient has a CO2 of 21, anion gap of 6, chloride level 116 -Avoid Nephrotoxic Medications, Contrast Dyes, Hypotension and Dehydration to Ensure Adequate Renal Perfusion and will need to Renally Adjust Meds -Continue to Monitor and Trend Renal Function carefully and repeat CMP in the AM    -Per family patient noted with some poor oral intake. -Status post gentle hydration with improvement so discontinued IV fluids.    Abnormal LFTs -Mild and Likely Reactive Recent Labs  Lab 11/10/22 1334 11/22/22 0328 11/23/22 0325 11/24/22 0537 11/25/22 0548  AST 17 53* 51* 105* 58*  ALT 10 96* 87* 128* 92*  -Repeat CMP in the AM and since her LFTs started worsening we will obtain a right upper quadrant ultrasound and showed "Gallbladder is not seen. This may suggest previous cholecystectomy or contracted gallbladder obscured by bowel gas. Proximal common bile duct is slightly prominent  measuring 6.1 mm. There is no dilation of intrahepatic bile ducts." -Repeat CMP in the AM    Normocytic Anemia -Hgb/Hct Trend: Recent Labs  Lab 11/10/22 1334 11/11/22 0512 11/14/22 0502 11/22/22 0328 11/23/22 0325 11/24/22 0537 11/25/22 0548  HGB 9.8* 11.0* 10.1* 11.0* 12.0 12.1 10.8*  HCT 33.1* 36.2 33.2* 35.2* 37.3 38.3 34.8*  MCV 89.7 86.2 86.9 86.1 83.8 85.5 86.6  -Continue to monitor for signs and symptoms of bleeding; no overt bleeding noted -Repeat CBC in a.m.   Hypoalbuminemia -Patient's Albumin is now gone from 3.0 -> 3.5 -> 3.1 -> 2.1 -Continue to Monitor and Trend -Repeat CMP in the AM   DVT prophylaxis: enoxaparin (LOVENOX) injection 30 mg Start: 11/12/22 2200    Code Status: DNR Family Communication: Discussed with multiple family members at bedside  Disposition Plan:  Level of care: Med-Surg Status is: Inpatient Remains inpatient appropriate because: She is medically stable for discharge however needs insurance authorization for long-term care for SNF this is still pending   Consultants:  None  Procedures:  As delineated as above  Antimicrobials:  Anti-infectives (From admission, onward)    Start     Dose/Rate Route Frequency Ordered Stop   11/11/22 1600  cefTRIAXone (ROCEPHIN) 1 g in sodium chloride 0.9 % 100 mL IVPB  Status:  Discontinued        1 g 200  mL/hr over 30 Minutes Intravenous Every 24 hours 11/10/22 1819 11/13/22 1201   11/10/22 1700  cefTRIAXone (ROCEPHIN) 2 g in sodium chloride 0.9 % 100 mL IVPB        2 g 200 mL/hr over 30 Minutes Intravenous  Once 11/10/22 1646 11/10/22 1820       Subjective: Seen and examined at bedside and she is much more awake and alert and laughing with her family.  She had no complaints and family states that she is doing much better today.  She denies any concerns or complaints at this time  Objective: Vitals:   11/24/22 2007 11/25/22 0500 11/25/22 0607 11/25/22 0718  BP: (!) 134/59  (!) 119/57 (!)  139/54  Pulse: 67  65 63  Resp: 16  16 18   Temp: 97.9 F (36.6 C)  (!) 97 F (36.1 C) 98 F (36.7 C)  TempSrc:      SpO2: 99%  100% 99%  Weight:  60.8 kg    Height:        Intake/Output Summary (Last 24 hours) at 11/25/2022 0902 Last data filed at 11/24/2022 1500 Gross per 24 hour  Intake 457.34 ml  Output --  Net 457.34 ml   Filed Weights   11/23/22 0500 11/24/22 0539 11/25/22 0500  Weight: 63.3 kg 59.4 kg 60.8 kg   Examination: Physical Exam:  Constitutional: Elderly Caucasian female currently no acute distress Respiratory: Diminished to auscultation bilaterally, no wheezing, rales, rhonchi or crackles. Normal respiratory effort and patient is not tachypenic. No accessory muscle use.  Unlabored breathing Cardiovascular: RRR, no murmurs / rubs / gallops. S1 and S2 auscultated. No extremity edema.  Abdomen: Soft, non-tender, mildly distended secondary to body habitus. Bowel sounds positive.  GU: Deferred. Musculoskeletal: No clubbing / cyanosis of digits/nails. No joint deformity upper and lower extremities.   Skin: No rashes, lesions, ulcers on limited skin evaluation. No induration; Warm and dry but does have some thin skin.  Neurologic: CN 2-12 grossly intact with no focal deficits. Romberg sign cerebellar reflexes not assessed.  Psychiatric: Normal judgment and insight. Alert and oriented x 2.  Pleasant and appropriate mood and affect.   Data Reviewed: I have personally reviewed following labs and imaging studies  CBC: Recent Labs  Lab 11/22/22 0328 11/23/22 0325 11/24/22 0537 11/25/22 0548  WBC 5.8 7.2 5.3 5.7  NEUTROABS 3.8 5.7 3.1 3.6  HGB 11.0* 12.0 12.1 10.8*  HCT 35.2* 37.3 38.3 34.8*  MCV 86.1 83.8 85.5 86.6  PLT 246 262 254 226   Basic Metabolic Panel: Recent Labs  Lab 11/20/22 0811 11/22/22 0328 11/23/22 0325 11/24/22 0537 11/25/22 0548  NA 142 141 140 139 143  K 4.3 3.8 3.8 4.0 3.7  CL 115* 112* 109 109 116*  CO2 22 23 22 22  21*  GLUCOSE  93 82 109* 97 86  BUN 34* 32* 26* 38* 40*  CREATININE 1.03* 1.00 0.98 1.21* 1.04*  CALCIUM 8.4* 9.0 9.2 8.5* 8.2*  MG  --  1.8 2.3 2.1 1.8  PHOS  --  4.0 4.1 4.1 3.2   GFR: Estimated Creatinine Clearance: 24.6 mL/min (A) (by C-G formula based on SCr of 1.04 mg/dL (H)). Liver Function Tests: Recent Labs  Lab 11/22/22 0328 11/23/22 0325 11/24/22 0537 11/25/22 0548  AST 53* 51* 105* 58*  ALT 96* 87* 128* 92*  ALKPHOS 117 139* 147* 129*  BILITOT 0.5 0.5 0.6 0.6  PROT 6.3* 7.2 6.7 5.9*  ALBUMIN 3.0* 3.5 3.1* 2.7*   No results  for input(s): "LIPASE", "AMYLASE" in the last 168 hours. No results for input(s): "AMMONIA" in the last 168 hours. Coagulation Profile: No results for input(s): "INR", "PROTIME" in the last 168 hours. Cardiac Enzymes: No results for input(s): "CKTOTAL", "CKMB", "CKMBINDEX", "TROPONINI" in the last 168 hours. BNP (last 3 results) No results for input(s): "PROBNP" in the last 8760 hours. HbA1C: No results for input(s): "HGBA1C" in the last 72 hours. CBG: No results for input(s): "GLUCAP" in the last 168 hours. Lipid Profile: No results for input(s): "CHOL", "HDL", "LDLCALC", "TRIG", "CHOLHDL", "LDLDIRECT" in the last 72 hours. Thyroid Function Tests: No results for input(s): "TSH", "T4TOTAL", "FREET4", "T3FREE", "THYROIDAB" in the last 72 hours. Anemia Panel: No results for input(s): "VITAMINB12", "FOLATE", "FERRITIN", "TIBC", "IRON", "RETICCTPCT" in the last 72 hours. Sepsis Labs: No results for input(s): "PROCALCITON", "LATICACIDVEN" in the last 168 hours.  Recent Results (from the past 240 hour(s))  Resp panel by RT-PCR (RSV, Flu A&B, Covid) Anterior Nasal Swab     Status: None   Collection Time: 11/19/22 12:12 PM   Specimen: Anterior Nasal Swab  Result Value Ref Range Status   SARS Coronavirus 2 by RT PCR NEGATIVE NEGATIVE Final    Comment: (NOTE) SARS-CoV-2 target nucleic acids are NOT DETECTED.  The SARS-CoV-2 RNA is generally detectable in  upper respiratory specimens during the acute phase of infection. The lowest concentration of SARS-CoV-2 viral copies this assay can detect is 138 copies/mL. A negative result does not preclude SARS-Cov-2 infection and should not be used as the sole basis for treatment or other patient management decisions. A negative result may occur with  improper specimen collection/handling, submission of specimen other than nasopharyngeal swab, presence of viral mutation(s) within the areas targeted by this assay, and inadequate number of viral copies(<138 copies/mL). A negative result must be combined with clinical observations, patient history, and epidemiological information. The expected result is Negative.  Fact Sheet for Patients:  BloggerCourse.com  Fact Sheet for Healthcare Providers:  SeriousBroker.it  This test is no t yet approved or cleared by the [place] FDA and  has been authorized for detection and/or diagnosis of SARS-CoV-2 by FDA under an Emergency Use Authorization (EUA). This EUA will remain  in effect (meaning this test can be used) for the duration of the COVID-19 declaration under Section 564(b)(1) of the Act, 21 U.S.C.section 360bbb-3(b)(1), unless the authorization is terminated  or revoked sooner.       Influenza A by PCR NEGATIVE NEGATIVE Final   Influenza B by PCR NEGATIVE NEGATIVE Final    Comment: (NOTE) The Xpert Xpress SARS-CoV-2/FLU/RSV plus assay is intended as an aid in the diagnosis of influenza from Nasopharyngeal swab specimens and should not be used as a sole basis for treatment. Nasal washings and aspirates are unacceptable for Xpert Xpress SARS-CoV-2/FLU/RSV testing.  Fact Sheet for Patients: BloggerCourse.com  Fact Sheet for Healthcare Providers: SeriousBroker.it  This test is not yet approved or cleared by the [place] FDA and has been  authorized for detection and/or diagnosis of SARS-CoV-2 by FDA under an Emergency Use Authorization (EUA). This EUA will remain in effect (meaning this test can be used) for the duration of the COVID-19 declaration under Section 564(b)(1) of the Act, 21 U.S.C. section 360bbb-3(b)(1), unless the authorization is terminated or revoked.     Resp Syncytial Virus by PCR NEGATIVE NEGATIVE Final    Comment: (NOTE) Fact Sheet for Patients: BloggerCourse.com  Fact Sheet for Healthcare Providers: SeriousBroker.it  This test is not yet approved or  cleared by the [place] and has been authorized for detection and/or diagnosis of SARS-CoV-2 by FDA under an Emergency Use Authorization (EUA). This EUA will remain in effect (meaning this test can be used) for the duration of the COVID-19 declaration under Section 564(b)(1) of the Act, 21 U.S.C. section 360bbb-3(b)(1), unless the authorization is terminated or revoked.  Performed at [facility], [address] [place], [place] [address]     Radiology Studies: US Abdomen Limited RUQ (LIVER/GB)  Result Date: 11/24/2022 CLINICAL DATA:  Abnormal liver function tests twenty-two EXAM: ULTRASOUND ABDOMEN LIMITED RIGHT UPPER QUADRANT COMPARISON:  None Available. FINDINGS: Gallbladder: Gallbladder is not seen. Common bile duct: Diameter: 6.1 mm Liver: No focal abnormalities are seen in the visualized portions of liver. Left lobe of liver is not adequately visualized. Portal vein is patent on color Doppler imaging with normal direction of blood flow towards the liver. Other: None. IMPRESSION: Gallbladder is not seen. This may suggest previous cholecystectomy or contracted gallbladder obscured by bowel gas. Proximal common bile duct is slightly prominent measuring 6.1 mm. There is no dilation of intrahepatic bile ducts. Electronically Signed   By: Ernie Avena M.D.   On: 11/24/2022  13:14    Scheduled Meds:  aspirin  81 mg Oral Daily   cholecalciferol  1,000 Units Oral Daily   docusate sodium  100 mg Oral BID   enoxaparin (LOVENOX) injection  30 mg Subcutaneous Q24H   ferrous sulfate  325 mg Oral Daily   fluticasone  1 spray Each Nare Daily   levothyroxine  75 mcg Oral Q0600   loratadine  10 mg Oral Daily   OLANZapine  2.5 mg Oral Daily   pantoprazole  40 mg Oral Daily   venlafaxine XR  150 mg Oral Daily   venlafaxine XR  75 mg Oral Daily   Continuous Infusions:  sodium chloride 75 mL/hr at 11/24/22 2140   magnesium sulfate bolus IVPB      LOS: 13 days   Marguerita Merles, DO [facility] Available via Epic secure chat 7am-7pm After these hours, please refer to coverage provider listed on [email or web address] 11/25/2022, 9:02 AM

## 2022-11-26 DIAGNOSIS — N3000 Acute cystitis without hematuria: Secondary | ICD-10-CM | POA: Diagnosis not present

## 2022-11-26 MED ORDER — HALOPERIDOL LACTATE 5 MG/ML IJ SOLN
2.0000 mg | Freq: Four times a day (QID) | INTRAMUSCULAR | Status: DC | PRN
  Administered 2022-11-26: 2 mg via INTRAVENOUS
  Filled 2022-11-26: qty 1

## 2022-11-26 NOTE — Progress Notes (Addendum)
PROGRESS NOTE    Ann Green  EUM:[id number] DOB: February 17, 1922 DOA: 11/10/2022 PCP: Ria Clock, MD    Brief Narrative:   Ann Green is a 89 y.o. female with past medical history significant for dementia, hypertension, hyperlipidemia presented to hospital with generalized weakness, fatigue and tiredness with episodes of confusion and not acting herself.  Patient lives at home and has been taken care of by caregivers.  Patient was initially prescribed Macrobid by her primary care physician and had taken 1 dose this but today she developed some slurred speech and some weakness in the left arm so she was brought to the hospital.  By the time she came to the hospital her weakness and slurred speech had improved.  Patient denies any nausea, vomiting, abdominal pain, fever or chills.  Denies any chest pain, shortness of breath, dyspnea.  Denies any syncope or lightheadedness.  Denies any sick contacts or recent travel.  Denies any runny nose, sore throat.  Denies any urinary urgency, frequency or dysuria.   Initial vitals in the ED was notable for elevated blood pressure with some bradycardia.  Patient did have some left foot cellulitis.  Does have a history of bradycardia at baseline.  Labs in the ED showed hemoglobin of 9.8 no leukocytosis.  Creatinine 0.8.  UA showed some white cells from 11/10/2022 done outside.  Urine culture was sent from the ED.  Patient was started on Rocephin IV and was considered for admission to the hospital for generalized weakness, confusion, possible cellulitis.   Interim History She started improved or hospitalization and was seen by PT and OT who recommended SNF.  She is being medically stable for discharge however her insurance authorization was denied and then appealed and the appeal for the insurance authorization is still pending and TOC stating that Insurance Form for Appeal needs to be completed by the Family. Now Appeal is canceled and awaiting SNF to pursue LTC  coverage.   Assessment & Plan:   Generalized weakness/Fatigue Patient presenting to ED with generalized weakness, fatigue likely multifactorial in the setting of UTI, dehydration and generalized decline from advanced age.  Patient currently living at home and being managed by a caretaker.  Completed antibiotic course for UTI, TSH, vitamin B12 low normal range and vitamin D levels normal.  Seen by PT who recommended SNF placement.  Unfortunately insurance declined SNF placement and previous hospitalist Dr. Grandville Silos engaged in the peer to peer review with MD at insurance company on 11/16/2022 in which SNF placement was declined again at that time.  Family have healed and now trying to seek long-term care placement. --per Son may not be until Tuesday 11/27/22 as they are trying for LTC Coverage now --continue supportive care   Chest wall pain Patient noted to have some chest wall pain which was reproducible on palpation, improving clinically. EKG done with normal sinus rhythm with no ischemic changes.  Clinical improvement noted with Voltaren gel. --Continue Voltaren gel as needed.   Possible Acute Cystitis Patient noted to have been prescribed Macrobid in the outpatient setting prior to admission. Urine cultures done during this hospitalization with no growth.  Completed 3-day course of IV ceftriaxone.   Acute Metabolic Encephalopathy/Mild Confusion, improving  Felt likely secondary to infectious etiology of UTI in the setting of underlying dementia.  Completed antibiotic course as above.  Now appears to be at her typical baseline per family. --Supportive care   Concern for TIA with some focal weakness -Patient already noted to be  on aspirin prior to admission. -Head CT done with no acute abnormalities. -Per Dr. Tyson Babinski, family did not wish to pursue any further workup including MRI as patient had improved and imaging would not likely change current management. -Continue Aspirin on discharge for  secondary stroke prophylaxis.   Hypothyroidism TSH 0.443 -- Continue Levothyroxine 75 mcg p.o. daily  History of Dementia --Delirium precautions --Get up during the day --Encourage a familiar face to remain present throughout the day --Keep blinds open and lights on during daylight hours --Minimize the use of opioids/benzodiazepines --Zyprexa 2.5 mg p.o. daily --Effexor 225 mg p.o. daily --Remeron 7.5 mg p.o. nightly as needed agitation/insomnia    Dehydration AKI on CKD Stage 3a Metabolic Acidosis  Patient with a slight increase in her BUN/creatinine.  Likely secondary to decreased oral intake. --Cr 0.84>>1.25>>0.98>1.21>1.04 --NS at 75 mL/h --Repeat CMP in am   Abnormal LFTs: Improving Right upper quadrant ultrasound notable for gallbladder not seen suggestive of previous cholecystectomy versus contracted gallbladder obscured by bowel gas, proximal common bile duct slightly prominent measuring 6.1 mm with no dilation of intrahepatic bile ducts. Last Labs         Recent Labs  Lab 11/10/22 1334 11/22/22 0328 11/23/22 0325 11/24/22 0537 11/25/22 0548  AST 17 53* 51* 105* 58*  ALT 10 96* 87* 128* 92*    --Repeat CMP in the AM    Normocytic Anemia Last Labs           Recent Labs  Lab 11/10/22 1334 11/11/22 0512 11/14/22 0502 11/22/22 0328 11/23/22 0325 11/24/22 0537 11/25/22 0548  HGB 9.8* 11.0* 10.1* 11.0* 12.0 12.1 10.8*  HCT 33.1* 36.2 33.2* 35.2* 37.3 38.3 34.8*  MCV 89.7 86.2 86.9 86.1 83.8 85.5 86.6    -- Continue to monitor for signs and symptoms of bleeding; no overt bleeding noted -- Repeat CBC in a.m.   Hypoalbuminemia -- Patient's Albumin is now gone from 3.0>3.5>3.1> 2.1>2.7 -- Continue to encourage increased oral intake -- CMP in the a.m.   DVT prophylaxis: enoxaparin (LOVENOX) injection 30 mg Start: 11/12/22 2200    Code Status: DNR Family Communication: Updated granddaughter present at bedside this morning  Disposition Plan:  Level  of care: Med-Surg Status is: Inpatient Remains inpatient appropriate because: Awaiting LTC placement    Consultants:  None  Procedures:  Right upper quadrant ultrasound  Antimicrobials:  Ceftriaxone 12/16 - 12/18   Subjective: Patient seen examined at bedside, resting comfortably.  Pleasantly confused.  Granddaughter present.  About to eat breakfast.  No specific complaints this morning.  Awaiting long-term care placement, medically stable for discharge once facility identified.  No other specific questions or concerns at this time.  Patient denies headache, no chest pain, no shortness of breath, no abdominal pain.  No acute events overnight reported by nursing staff.  Objective: Vitals:   11/25/22 2003 11/26/22 0353 11/26/22 0434 11/26/22 0832  BP: (!) 144/50  (!) 174/73 (!) 151/65  Pulse: 64  85 74  Resp: 17  18 20   Temp: 98.6 F (37 C)  99 F (37.2 C) 98 F (36.7 C)  TempSrc: Oral  Axillary Oral  SpO2: 98%  98% 100%  Weight:  61.7 kg    Height:        Intake/Output Summary (Last 24 hours) at 11/26/2022 1123 Last data filed at 11/26/2022 0618 Gross per 24 hour  Intake --  Output 2 ml  Net -2 ml   Filed Weights   11/24/22 0539 11/25/22 0500 11/26/22 0353  Weight: 59.4 kg 60.8 kg 61.7 kg    Examination:  Physical Exam: GEN: NAD, alert, pleasantly confused, elderly in appearance HEENT: NCAT, PERRL, EOMI, sclera clear, MMM PULM: CTAB w/o wheezes/crackles, normal respiratory effort, on room air CV: RRR w/o M/G/R GI: abd soft, NTND, NABS MSK: no peripheral edema, moves all extremities independently NEURO: No focal neurological deficits appreciated PSYCH: normal mood/affect Integumentary: dry/intact, no rashes or wounds    Data Reviewed: I have personally reviewed following labs and imaging studies  CBC: Recent Labs  Lab 11/22/22 0328 11/23/22 0325 11/24/22 0537 11/25/22 0548  WBC 5.8 7.2 5.3 5.7  NEUTROABS 3.8 5.7 3.1 3.6  HGB 11.0* 12.0 12.1 10.8*   HCT 35.2* 37.3 38.3 34.8*  MCV 86.1 83.8 85.5 86.6  PLT 246 262 254 226   Basic Metabolic Panel: Recent Labs  Lab 11/20/22 0811 11/22/22 0328 11/23/22 0325 11/24/22 0537 11/25/22 0548  NA 142 141 140 139 143  K 4.3 3.8 3.8 4.0 3.7  CL 115* 112* 109 109 116*  CO2 22 23 22 22  21*  GLUCOSE 93 82 109* 97 86  BUN 34* 32* 26* 38* 40*  CREATININE 1.03* 1.00 0.98 1.21* 1.04*  CALCIUM 8.4* 9.0 9.2 8.5* 8.2*  MG  --  1.8 2.3 2.1 1.8  PHOS  --  4.0 4.1 4.1 3.2   GFR: Estimated Creatinine Clearance: 24.8 mL/min (A) (by C-G formula based on SCr of 1.04 mg/dL (H)). Liver Function Tests: Recent Labs  Lab 11/22/22 0328 11/23/22 0325 11/24/22 0537 11/25/22 0548  AST 53* 51* 105* 58*  ALT 96* 87* 128* 92*  ALKPHOS 117 139* 147* 129*  BILITOT 0.5 0.5 0.6 0.6  PROT 6.3* 7.2 6.7 5.9*  ALBUMIN 3.0* 3.5 3.1* 2.7*   No results for input(s): "LIPASE", "AMYLASE" in the last 168 hours. No results for input(s): "AMMONIA" in the last 168 hours. Coagulation Profile: No results for input(s): "INR", "PROTIME" in the last 168 hours. Cardiac Enzymes: No results for input(s): "CKTOTAL", "CKMB", "CKMBINDEX", "TROPONINI" in the last 168 hours. BNP (last 3 results) No results for input(s): "PROBNP" in the last 8760 hours. HbA1C: No results for input(s): "HGBA1C" in the last 72 hours. CBG: No results for input(s): "GLUCAP" in the last 168 hours. Lipid Profile: No results for input(s): "CHOL", "HDL", "LDLCALC", "TRIG", "CHOLHDL", "LDLDIRECT" in the last 72 hours. Thyroid Function Tests: No results for input(s): "TSH", "T4TOTAL", "FREET4", "T3FREE", "THYROIDAB" in the last 72 hours. Anemia Panel: No results for input(s): "VITAMINB12", "FOLATE", "FERRITIN", "TIBC", "IRON", "RETICCTPCT" in the last 72 hours. Sepsis Labs: No results for input(s): "PROCALCITON", "LATICACIDVEN" in the last 168 hours.  Recent Results (from the past 240 hour(s))  Resp panel by RT-PCR (RSV, Flu A&B, Covid) Anterior  Nasal Swab     Status: None   Collection Time: 11/19/22 12:12 PM   Specimen: Anterior Nasal Swab  Result Value Ref Range Status   SARS Coronavirus 2 by RT PCR NEGATIVE NEGATIVE Final    Comment: (NOTE) SARS-CoV-2 target nucleic acids are NOT DETECTED.  The SARS-CoV-2 RNA is generally detectable in upper respiratory specimens during the acute phase of infection. The lowest concentration of SARS-CoV-2 viral copies this assay can detect is 138 copies/mL. A negative result does not preclude SARS-Cov-2 infection and should not be used as the sole basis for treatment or other patient management decisions. A negative result may occur with  improper specimen collection/handling, submission of specimen other than nasopharyngeal swab, presence of viral mutation(s) within the areas targeted by  this assay, and inadequate number of viral copies(<138 copies/mL). A negative result must be combined with clinical observations, patient history, and epidemiological information. The expected result is Negative.  Fact Sheet for Patients:  EntrepreneurPulse.com.au  Fact Sheet for Healthcare Providers:  IncredibleEmployment.be  This test is no t yet approved or cleared by the [place] FDA and  has been authorized for detection and/or diagnosis of SARS-CoV-2 by FDA under an Emergency Use Authorization (EUA). This EUA will remain  in effect (meaning this test can be used) for the duration of the COVID-19 declaration under Section 564(b)(1) of the Act, 21 U.S.C.section 360bbb-3(b)(1), unless the authorization is terminated  or revoked sooner.       Influenza A by PCR NEGATIVE NEGATIVE Final   Influenza B by PCR NEGATIVE NEGATIVE Final    Comment: (NOTE) The Xpert Xpress SARS-CoV-2/FLU/RSV plus assay is intended as an aid in the diagnosis of influenza from Nasopharyngeal swab specimens and should not be used as a sole basis for treatment. Nasal washings  and aspirates are unacceptable for Xpert Xpress SARS-CoV-2/FLU/RSV testing.  Fact Sheet for Patients: EntrepreneurPulse.com.au  Fact Sheet for Healthcare Providers: IncredibleEmployment.be  This test is not yet approved or cleared by the [place] FDA and has been authorized for detection and/or diagnosis of SARS-CoV-2 by FDA under an Emergency Use Authorization (EUA). This EUA will remain in effect (meaning this test can be used) for the duration of the COVID-19 declaration under Section 564(b)(1) of the Act, 21 U.S.C. section 360bbb-3(b)(1), unless the authorization is terminated or revoked.     Resp Syncytial Virus by PCR NEGATIVE NEGATIVE Final    Comment: (NOTE) Fact Sheet for Patients: EntrepreneurPulse.com.au  Fact Sheet for Healthcare Providers: IncredibleEmployment.be  This test is not yet approved or cleared by the [place] FDA and has been authorized for detection and/or diagnosis of SARS-CoV-2 by FDA under an Emergency Use Authorization (EUA). This EUA will remain in effect (meaning this test can be used) for the duration of the COVID-19 declaration under Section 564(b)(1) of the Act, 21 U.S.C. section 360bbb-3(b)(1), unless the authorization is terminated or revoked.  Performed at [facility], [address] [place], [place] [address]          Radiology Studies: US Abdomen Limited RUQ (LIVER/GB)  Result Date: 11/24/2022 CLINICAL DATA:  Abnormal liver function tests twenty-two EXAM: ULTRASOUND ABDOMEN LIMITED RIGHT UPPER QUADRANT COMPARISON:  None Available. FINDINGS: Gallbladder: Gallbladder is not seen. Common bile duct: Diameter: 6.1 mm Liver: No focal abnormalities are seen in the visualized portions of liver. Left lobe of liver is not adequately visualized. Portal vein is patent on color Doppler imaging with normal direction of blood flow towards the liver.  Other: None. IMPRESSION: Gallbladder is not seen. This may suggest previous cholecystectomy or contracted gallbladder obscured by bowel gas. Proximal common bile duct is slightly prominent measuring 6.1 mm. There is no dilation of intrahepatic bile ducts. Electronically Signed   By: Elmer Picker M.D.   On: 11/24/2022 13:14        Scheduled Meds:  aspirin  81 mg Oral Daily   cholecalciferol  1,000 Units Oral Daily   docusate sodium  100 mg Oral BID   enoxaparin (LOVENOX) injection  30 mg Subcutaneous Q24H   ferrous sulfate  325 mg Oral Daily   fluticasone  1 spray Each Nare Daily   levothyroxine  75 mcg Oral Q0600   loratadine  10 mg Oral Daily   OLANZapine  2.5 mg Oral  Daily   pantoprazole  40 mg Oral Daily   venlafaxine XR  150 mg Oral Daily   venlafaxine XR  75 mg Oral Daily   Continuous Infusions:  sodium chloride 75 mL/hr at 11/26/22 0118     LOS: 14 days    Time spent: 48 minutes spent on chart review, discussion with nursing staff, consultants, updating family and interview/physical exam; more than 50% of that time was spent in counseling and/or coordination of care.    Alvira Philips [place], DO [facility] Available via Epic secure chat 7am-7pm After these hours, please refer to coverage provider listed on [email or web address] 11/26/2022, 11:23 AM

## 2022-11-26 NOTE — TOC Progression Note (Signed)
Transition of Care [facility]) - Progression Note    Patient Details  Name: Ann Green MRN: [id number] Date of Birth: [birth year]  [facility]) CM/SW Contact  Gerilyn Pilgrim, LCSW Phone Number: 11/26/2022, 2:00 PM  Clinical Narrative:   SW left voicemail with Tammy from [employer] to obtain information about the status of the appeal.          Expected Discharge Plan and Services         Expected Discharge Date: 11/15/22                                     Social Determinants of Health ([place]) Interventions [place]: No Food Insecurity (11/10/2022)  Housing: Low Risk  (11/10/2022)  Transportation Needs: No Transportation Needs (11/10/2022)  Utilities: Not At Risk (11/10/2022)  Tobacco Use: Low Risk  (11/10/2022)    Readmission Risk Interventions     No data to display

## 2022-11-27 DIAGNOSIS — N3000 Acute cystitis without hematuria: Secondary | ICD-10-CM | POA: Diagnosis not present

## 2022-11-27 LAB — CBC
HCT: 34.3 % — ABNORMAL LOW (ref 36.0–46.0)
Hemoglobin: 10.6 g/dL — ABNORMAL LOW (ref 12.0–15.0)
MCH: 26.8 pg (ref 26.0–34.0)
MCHC: 30.9 g/dL (ref 30.0–36.0)
MCV: 86.8 fL (ref 80.0–100.0)
Platelets: 234 10*3/uL (ref 150–400)
RBC: 3.95 MIL/uL (ref 3.87–5.11)
RDW: 19 % — ABNORMAL HIGH (ref 11.5–15.5)
WBC: 5.5 10*3/uL (ref 4.0–10.5)
nRBC: 0 % (ref 0.0–0.2)

## 2022-11-27 LAB — COMPREHENSIVE METABOLIC PANEL
ALT: 50 U/L — ABNORMAL HIGH (ref 0–44)
AST: 28 U/L (ref 15–41)
Albumin: 2.9 g/dL — ABNORMAL LOW (ref 3.5–5.0)
Alkaline Phosphatase: 119 U/L (ref 38–126)
Anion gap: 5 (ref 5–15)
BUN: 17 mg/dL (ref 8–23)
CO2: 23 mmol/L (ref 22–32)
Calcium: 8.2 mg/dL — ABNORMAL LOW (ref 8.9–10.3)
Chloride: 115 mmol/L — ABNORMAL HIGH (ref 98–111)
Creatinine, Ser: 0.85 mg/dL (ref 0.44–1.00)
GFR, Estimated: >60 mL/min (ref >60)
Glucose, Bld: 90 mg/dL (ref 70–99)
Potassium: 3.8 mmol/L (ref 3.5–5.1)
Sodium: 143 mmol/L (ref 135–145)
Total Bilirubin: 0.6 mg/dL (ref 0.3–1.2)
Total Protein: 6.3 g/dL — ABNORMAL LOW (ref 6.5–8.1)

## 2022-11-27 MED ORDER — AMLODIPINE BESYLATE 5 MG PO TABS: 5.0000 mg | ORAL_TABLET | Freq: Every day | ORAL | Status: DC

## 2022-11-27 MED ORDER — AMLODIPINE BESYLATE 5 MG PO TABS
5.0000 mg | ORAL_TABLET | Freq: Every day | ORAL | Status: DC
  Administered 2022-11-27: 5 mg via ORAL
  Filled 2022-11-27: qty 1

## 2022-11-27 MED ORDER — OLANZAPINE 2.5 MG PO TABS: 2.5000 mg | ORAL_TABLET | Freq: Every day | ORAL | 0 refills | Status: DC

## 2022-11-27 MED ORDER — MIRTAZAPINE 15 MG PO TBDP: 7.5000 mg | ORAL_TABLET | Freq: Every evening | ORAL | Status: DC | PRN

## 2022-11-27 NOTE — Discharge Summary (Signed)
Physician Discharge Summary  Ann Green:[id number] DOB: Apr 02, 1922 DOA: 11/10/2022  PCP: Ria Clock, MD  Admit date: 11/10/2022 Discharge date: 11/27/2022  Admitted From: Home Disposition: SNF  Recommendations for Outpatient Follow-up:  Follow up with PCP in 1-2 weeks Started on amlodipine 5 mg p.o. daily for elevated blood pressure, continue to follow-up and adjust accordingly Encourage continued goals of care discussion outpatient Recommend CMP 1-2 weeks  Discharge Condition: Stable CODE STATUS: DNR Diet recommendation: Heart healthy diet  History of present illness:  Ann Green is a 89 y.o. female with past medical history significant for dementia, hypertension, hyperlipidemia presented to hospital with generalized weakness, fatigue and tiredness with episodes of confusion and not acting herself.  Patient lives at home and has been taken care of by caregivers.  Patient was initially prescribed Macrobid by her primary care physician and had taken 1 dose this but today she developed some slurred speech and some weakness in the left arm so she was brought to the hospital.  By the time she came to the hospital her weakness and slurred speech had improved.  Patient denies any nausea, vomiting, abdominal pain, fever or chills.  Denies any chest pain, shortness of breath, dyspnea.  Denies any syncope or lightheadedness.  Denies any sick contacts or recent travel.  Denies any runny nose, sore throat.  Denies any urinary urgency, frequency or dysuria.   Initial vitals in the ED was notable for elevated blood pressure with some bradycardia.  Patient did have some left foot cellulitis.  Does have a history of bradycardia at baseline.  Labs in the ED showed hemoglobin of 9.8 no leukocytosis.  Creatinine 0.8.  UA showed some white cells from 11/10/2022 done outside.  Urine culture was sent from the ED.  Patient was started on Rocephin IV and was considered for admission to the hospital for  generalized weakness, confusion, possible cellulitis.  Hospital course:  Generalized weakness/Fatigue Patient presenting to ED with generalized weakness, fatigue likely multifactorial in the setting of UTI, dehydration and generalized decline from advanced age.  Patient currently living at home and being managed by a caretaker.  Completed antibiotic course for UTI, TSH, vitamin B12 low normal range and vitamin D levels normal.  Seen by PT who recommended SNF placement. Unfortunately insurance declined SNF placement and previous hospitalist Dr. Grandville Silos engaged in the peer to peer review with MD at insurance company on 11/16/2022 in which SNF placement was declined again at that time.  Family appealed declination from insurance company and now approved.  Discharging to SNF for further rehabilitation.   Chest wall pain Patient noted to have some chest wall pain which was reproducible on palpation, improving clinically. EKG done with normal sinus rhythm with no ischemic changes.  Clinical improvement noted with Voltaren gel. Continue Voltaren gel as needed.   Possible Acute Cystitis Patient noted to have been prescribed Macrobid in the outpatient setting prior to admission. Urine cultures done during this hospitalization with no growth.  Completed 3-day course of IV ceftriaxone.   Acute Metabolic Encephalopathy/Mild Confusion, improving  Felt likely secondary to infectious etiology of UTI in the setting of underlying dementia.  Completed antibiotic course as above.  Now appears to be at her typical baseline per family.   Concern for TIA with some focal weakness CT head without contrast with no acute intracranial normalities on admission.  Per previous hospitalist, Dr. Louanne Belton, family did not want to pursue any further workup including MRI as patient had improved and  imaging would not likely change current management. Continue aspirin   Hypothyroidism TSH 0.443 Continue Levothyroxine 75 mcg p.o.  daily   History of Dementia --Delirium precautions --Get up during the day --Encourage a familiar face to remain present throughout the day --Keep blinds open and lights on during daylight hours --Minimize the use of opioids/benzodiazepines --Zyprexa 2.5 mg p.o. nightly --Effexor 225 mg p.o. daily --Remeron 7.5 mg p.o. nightly as needed agitation/insomnia  Dehydration AKI on CKD Stage 3a Metabolic Acidosis  Patient with a slight increase in her BUN/creatinine.  Likely secondary to decreased oral intake.  Creatinine peaked at 1.25 during the hospitalization now down to 0.81 at time of discharge.   Abnormal LFTs: Improving Right upper quadrant ultrasound notable for gallbladder not seen suggestive of previous cholecystectomy versus contracted gallbladder obscured by bowel gas, proximal common bile duct slightly prominent measuring 6.1 mm with no dilation of intrahepatic bile ducts.   Discharge Diagnoses:  Principal Problem:   UTI (urinary tract infection) Active Problems:   Thyroid disease   Essential hypertension   Hyperlipidemia   Acute metabolic encephalopathy   Generalized weakness   Weakness    Discharge Instructions  Discharge Instructions     Call MD for:  difficulty breathing, headache or visual disturbances   Complete by: As directed    Call MD for:  extreme fatigue   Complete by: As directed    Call MD for:  persistant dizziness or light-headedness   Complete by: As directed    Call MD for:  persistant nausea and vomiting   Complete by: As directed    Call MD for:  persistant nausea and vomiting   Complete by: As directed    Call MD for:  severe uncontrolled pain   Complete by: As directed    Call MD for:  severe uncontrolled pain   Complete by: As directed    Call MD for:  temperature >100.4   Complete by: As directed    Call MD for:  temperature >100.4   Complete by: As directed    Diet - low sodium heart healthy   Complete by: As directed    Diet  general   Complete by: As directed    Discharge instructions   Complete by: As directed    Follow-up with your primary care provider in 1 week.  Complete the course of antibiotic that you have at home.  Increase fluid intake.  Seek medical attention for worsening symptoms.   Increase activity slowly   Complete by: As directed    Increase activity slowly   Complete by: As directed    Increase activity slowly   Complete by: As directed       Allergies as of 11/27/2022       Reactions   Codeine Other (See Comments)   Other reaction(s): Hallucination        Medication List     STOP taking these medications    nitrofurantoin (macrocrystal-monohydrate) 100 MG capsule Commonly known as: MACROBID       TAKE these medications    acetaminophen 500 MG tablet Commonly known as: TYLENOL Take 500-1,000 mg by mouth daily.   amLODipine 5 MG tablet Commonly known as: NORVASC Take 1 tablet (5 mg total) by mouth daily. Start taking on: November 28, 2022   aspirin 81 MG chewable tablet Chew 81 mg by mouth daily.   cholecalciferol 25 MCG (1000 UNIT) tablet Commonly known as: VITAMIN D3 Take 1,000 Units by mouth daily.  cyclobenzaprine 5 MG tablet Commonly known as: FLEXERIL Take 1 tablet (5 mg total) by mouth 3 (three) times daily as needed for muscle spasms.   ferrous sulfate 325 (65 FE) MG tablet Take 325 mg by mouth every morning.   levothyroxine 75 MCG tablet Commonly known as: SYNTHROID Take 75 mcg by mouth daily.   mirtazapine 15 MG disintegrating tablet Commonly known as: REMERON SOL-TAB Take 0.5 tablets (7.5 mg total) by mouth at bedtime as needed (agitation/insomnia).   OLANZapine 2.5 MG tablet Commonly known as: ZYPREXA Take 1 tablet (2.5 mg total) by mouth at bedtime.   omeprazole 20 MG capsule Commonly known as: PRILOSEC Take 20 mg by mouth daily.   venlafaxine XR 150 MG 24 hr capsule Commonly known as: EFFEXOR-XR Take 150 mg by mouth daily. (take with  75mg  dose to equal 225mg  daily)   venlafaxine XR 75 MG 24 hr capsule Commonly known as: EFFEXOR-XR Take 75 mg by mouth daily. (take with 150mg  dose to equal 225mg  daily)       ASK your doctor about these medications    diclofenac Sodium 1 % Gel Commonly known as: VOLTAREN Apply 2 g topically 4 (four) times daily for 7 days. Apply to chest wall Ask about: Should I take this medication?        Follow-up Information     , MD Follow up in 1 week(s).   Specialty: Internal Medicine Contact information: [address] [place] [phone]         MD at SNF Follow up.                 Allergies  Allergen Reactions   Codeine Other (See Comments)    Other reaction(s): Hallucination     Consultations: None   Procedures/Studies: [place] Abdomen Limited RUQ (LIVER/GB)  Result Date: 11/24/2022 CLINICAL DATA:  Abnormal liver function tests twenty-two EXAM: ULTRASOUND ABDOMEN LIMITED RIGHT UPPER QUADRANT COMPARISON:  None Available. FINDINGS: Gallbladder: Gallbladder is not seen. Common bile duct: Diameter: 6.1 mm Liver: No focal abnormalities are seen in the visualized portions of liver. Left lobe of liver is not adequately visualized. Portal vein is patent on color Doppler imaging with normal direction of blood flow towards the liver. Other: None. IMPRESSION: Gallbladder is not seen. This may suggest previous cholecystectomy or contracted gallbladder obscured by bowel gas. Proximal common bile duct is slightly prominent measuring 6.1 mm. There is no dilation of intrahepatic bile ducts. Electronically Signed   By: [id number] M.D.   On: 11/24/2022 13:14   CT HEAD WO CONTRAST ([place])  Result Date: 11/10/2022 CLINICAL DATA:  Mental status change EXAM: CT HEAD WITHOUT CONTRAST TECHNIQUE: Contiguous axial images were obtained from the base of the skull through the vertex without intravenous contrast. RADIATION DOSE REDUCTION: This exam  was performed according to the departmental dose-optimization program which includes automated exposure control, adjustment of the mA and/or kV according to patient size and/or use of iterative reconstruction technique. COMPARISON:  CT brain 05/24/2020 FINDINGS: Brain: No acute territorial infarction, hemorrhage, or intracranial mass. Moderate atrophy. Moderate severe chronic small vessel ischemic changes of the white matter. Chronic lacunar infarcts in the basal ganglia. Stable ventricle size. Vascular: No hyperdense vessels.  Carotid vascular calcification Skull: Normal. Negative for fracture or focal lesion. Sinuses/Orbits: No acute finding. Other: None IMPRESSION: 1. No CT evidence for acute intracranial abnormality. 2. Atrophy and chronic small vessel ischemic changes of the white matter. Electronically Signed  By: Donavan Foil M.D.   On: 11/10/2022 16:00     Subjective: Patient seen and examined at bedside, resting comfortably.  Sleeping.  Family present.  Discharging to SNF today.  No other questions or concerns at this time.  No acute events overnight per nursing staff.  Discharge Exam: Vitals:   11/27/22 0433 11/27/22 0829  BP: (!) 173/62 (!) 179/70  Pulse: (!) 57 (!) 58  Resp: 16 18  Temp: 97.6 F (36.4 C) (!) 97.4 F (36.3 C)  SpO2: 100% 100%   Vitals:   11/26/22 2031 11/27/22 0433 11/27/22 0500 11/27/22 0829  BP: (!) 160/79 (!) 173/62  (!) 179/70  Pulse: 72 (!) 57  (!) 58  Resp: 18 16  18   Temp: (!) 97.4 F (36.3 C) 97.6 F (36.4 C)  (!) 97.4 F (36.3 C)  TempSrc: Oral Oral    SpO2: 96% 100%  100%  Weight:   60.3 kg   Height:        Physical Exam: GEN: NAD, alert, pleasantly confused, elderly in appearance HEENT: NCAT, PERRL, EOMI, sclera clear, MMM PULM: CTAB w/o wheezes/crackles, normal respiratory effort, on room air CV: RRR w/o M/G/R GI: abd soft, NTND, NABS, no R/G/M MSK: no peripheral edema, moves all extremities independently NEURO: No focal neurological  deficits appreciated PSYCH: normal mood/affect Integumentary: dry/intact, no rashes or wounds    The results of significant diagnostics from this hospitalization (including imaging, microbiology, ancillary and laboratory) are listed below for reference.     Microbiology: Recent Results (from the past 240 hour(s))  Resp panel by RT-PCR (RSV, Flu A&B, Covid) Anterior Nasal Swab     Status: None   Collection Time: 11/19/22 12:12 PM   Specimen: Anterior Nasal Swab  Result Value Ref Range Status   SARS Coronavirus 2 by RT PCR NEGATIVE NEGATIVE Final    Comment: (NOTE) SARS-CoV-2 target nucleic acids are NOT DETECTED.  The SARS-CoV-2 RNA is generally detectable in upper respiratory specimens during the acute phase of infection. The lowest concentration of SARS-CoV-2 viral copies this assay can detect is 138 copies/mL. A negative result does not preclude SARS-Cov-2 infection and should not be used as the sole basis for treatment or other patient management decisions. A negative result may occur with  improper specimen collection/handling, submission of specimen other than nasopharyngeal swab, presence of viral mutation(s) within the areas targeted by this assay, and inadequate number of viral copies(<138 copies/mL). A negative result must be combined with clinical observations, patient history, and epidemiological information. The expected result is Negative.  Fact Sheet for Patients:  EntrepreneurPulse.com.au  Fact Sheet for Healthcare Providers:  IncredibleEmployment.be  This test is no t yet approved or cleared by the [place] FDA and  has been authorized for detection and/or diagnosis of SARS-CoV-2 by FDA under an Emergency Use Authorization (EUA). This EUA will remain  in effect (meaning this test can be used) for the duration of the COVID-19 declaration under Section 564(b)(1) of the Act, 21 U.S.C.section 360bbb-3(b)(1), unless the  authorization is terminated  or revoked sooner.       Influenza A by PCR NEGATIVE NEGATIVE Final   Influenza B by PCR NEGATIVE NEGATIVE Final    Comment: (NOTE) The Xpert Xpress SARS-CoV-2/FLU/RSV plus assay is intended as an aid in the diagnosis of influenza from Nasopharyngeal swab specimens and should not be used as a sole basis for treatment. Nasal washings and aspirates are unacceptable for Xpert Xpress SARS-CoV-2/FLU/RSV testing.  Fact Sheet for Patients: EntrepreneurPulse.com.au  Fact Sheet for Healthcare Providers: SeriousBroker.ithttps://www.fda.gov/media/152162/download  This test is not yet approved or cleared by the [place] FDA and has been authorized for detection and/or diagnosis of SARS-CoV-2 by FDA under an Emergency Use Authorization (EUA). This EUA will remain in effect (meaning this test can be used) for the duration of the COVID-19 declaration under Section 564(b)(1) of the Act, 21 U.S.C. section 360bbb-3(b)(1), unless the authorization is terminated or revoked.     Resp Syncytial Virus by PCR NEGATIVE NEGATIVE Final    Comment: (NOTE) Fact Sheet for Patients: BloggerCourse.comhttps://www.fda.gov/media/152166/download  Fact Sheet for Healthcare Providers: SeriousBroker.ithttps://www.fda.gov/media/152162/download  This test is not yet approved or cleared by the [place] FDA and has been authorized for detection and/or diagnosis of SARS-CoV-2 by FDA under an Emergency Use Authorization (EUA). This EUA will remain in effect (meaning this test can be used) for the duration of the COVID-19 declaration under Section 564(b)(1) of the Act, 21 U.S.C. section 360bbb-3(b)(1), unless the authorization is terminated or revoked.  Performed at [facility], [address] [place], [place] [id number]      Labs: BNP (last 3 results) No results for input(s): "BNP" in the last 8760 hours. Basic Metabolic Panel: Recent Labs  Lab 11/22/22 0328 11/23/22 0325 11/24/22 0537  11/25/22 0548 11/27/22 0511  NA 141 140 139 143 143  K 3.8 3.8 4.0 3.7 3.8  CL 112* 109 109 116* 115*  CO2 23 22 22  21* 23  GLUCOSE 82 109* 97 86 90  BUN 32* 26* 38* 40* 17  CREATININE 1.00 0.98 1.21* 1.04* 0.85  CALCIUM 9.0 9.2 8.5* 8.2* 8.2*  MG 1.8 2.3 2.1 1.8  --   PHOS 4.0 4.1 4.1 3.2  --    Liver Function Tests: Recent Labs  Lab 11/22/22 0328 11/23/22 0325 11/24/22 0537 11/25/22 0548 11/27/22 0511  AST 53* 51* 105* 58* 28  ALT 96* 87* 128* 92* 50*  ALKPHOS 117 139* 147* 129* 119  BILITOT 0.5 0.5 0.6 0.6 0.6  PROT 6.3* 7.2 6.7 5.9* 6.3*  ALBUMIN 3.0* 3.5 3.1* 2.7* 2.9*   No results for input(s): "LIPASE", "AMYLASE" in the last 168 hours. No results for input(s): "AMMONIA" in the last 168 hours. CBC: Recent Labs  Lab 11/22/22 0328 11/23/22 0325 11/24/22 0537 11/25/22 0548 11/27/22 0511  WBC 5.8 7.2 5.3 5.7 5.5  NEUTROABS 3.8 5.7 3.1 3.6  --   HGB 11.0* 12.0 12.1 10.8* 10.6*  HCT 35.2* 37.3 38.3 34.8* 34.3*  MCV 86.1 83.8 85.5 86.6 86.8  PLT 246 262 254 226 234   Cardiac Enzymes: No results for input(s): "CKTOTAL", "CKMB", "CKMBINDEX", "TROPONINI" in the last 168 hours. BNP: Invalid input(s): "POCBNP" CBG: No results for input(s): "GLUCAP" in the last 168 hours. D-Dimer No results for input(s): "DDIMER" in the last 72 hours. Hgb A1c No results for input(s): "HGBA1C" in the last 72 hours. Lipid Profile No results for input(s): "CHOL", "HDL", "LDLCALC", "TRIG", "CHOLHDL", "LDLDIRECT" in the last 72 hours. Thyroid function studies No results for input(s): "TSH", "T4TOTAL", "T3FREE", "THYROIDAB" in the last 72 hours.  Invalid input(s): "FREET3" Anemia work up No results for input(s): "VITAMINB12", "FOLATE", "FERRITIN", "TIBC", "IRON", "RETICCTPCT" in the last 72 hours. Urinalysis    Component Value Date/Time   COLORURINE YELLOW (A) 11/13/2022 0730   APPEARANCEUR HAZY (A) 11/13/2022 0730   LABSPEC 1.025 11/13/2022 0730   PHURINE 5.0 11/13/2022 0730    GLUCOSEU NEGATIVE 11/13/2022 0730   HGBUR NEGATIVE 11/13/2022 0730   BILIRUBINUR NEGATIVE 11/13/2022  0730   KETONESUR NEGATIVE 11/13/2022 0730   PROTEINUR 30 (A) 11/13/2022 0730   NITRITE NEGATIVE 11/13/2022 0730   LEUKOCYTESUR NEGATIVE 11/13/2022 0730   Sepsis Labs Recent Labs  Lab 11/23/22 0325 11/24/22 0537 11/25/22 0548 11/27/22 0511  WBC 7.2 5.3 5.7 5.5   Microbiology Recent Results (from the past 240 hour(s))  Resp panel by RT-PCR (RSV, Flu A&B, Covid) Anterior Nasal Swab     Status: None   Collection Time: 11/19/22 12:12 PM   Specimen: Anterior Nasal Swab  Result Value Ref Range Status   SARS Coronavirus 2 by RT PCR NEGATIVE NEGATIVE Final    Comment: (NOTE) SARS-CoV-2 target nucleic acids are NOT DETECTED.  The SARS-CoV-2 RNA is generally detectable in upper respiratory specimens during the acute phase of infection. The lowest concentration of SARS-CoV-2 viral copies this assay can detect is 138 copies/mL. A negative result does not preclude SARS-Cov-2 infection and should not be used as the sole basis for treatment or other patient management decisions. A negative result may occur with  improper specimen collection/handling, submission of specimen other than nasopharyngeal swab, presence of viral mutation(s) within the areas targeted by this assay, and inadequate number of viral copies(<138 copies/mL). A negative result must be combined with clinical observations, patient history, and epidemiological information. The expected result is Negative.  Fact Sheet for Patients:  BloggerCourse.com  Fact Sheet for Healthcare Providers:  SeriousBroker.it  This test is no t yet approved or cleared by the [place] FDA and  has been authorized for detection and/or diagnosis of SARS-CoV-2 by FDA under an Emergency Use Authorization (EUA). This EUA will remain  in effect (meaning this test can be used) for the  duration of the COVID-19 declaration under Section 564(b)(1) of the Act, 21 U.S.C.section 360bbb-3(b)(1), unless the authorization is terminated  or revoked sooner.       Influenza A by PCR NEGATIVE NEGATIVE Final   Influenza B by PCR NEGATIVE NEGATIVE Final    Comment: (NOTE) The Xpert Xpress SARS-CoV-2/FLU/RSV plus assay is intended as an aid in the diagnosis of influenza from Nasopharyngeal swab specimens and should not be used as a sole basis for treatment. Nasal washings and aspirates are unacceptable for Xpert Xpress SARS-CoV-2/FLU/RSV testing.  Fact Sheet for Patients: BloggerCourse.com  Fact Sheet for Healthcare Providers: SeriousBroker.it  This test is not yet approved or cleared by the [place] FDA and has been authorized for detection and/or diagnosis of SARS-CoV-2 by FDA under an Emergency Use Authorization (EUA). This EUA will remain in effect (meaning this test can be used) for the duration of the COVID-19 declaration under Section 564(b)(1) of the Act, 21 U.S.C. section 360bbb-3(b)(1), unless the authorization is terminated or revoked.     Resp Syncytial Virus by PCR NEGATIVE NEGATIVE Final    Comment: (NOTE) Fact Sheet for Patients: BloggerCourse.com  Fact Sheet for Healthcare Providers: SeriousBroker.it  This test is not yet approved or cleared by the [place] FDA and has been authorized for detection and/or diagnosis of SARS-CoV-2 by FDA under an Emergency Use Authorization (EUA). This EUA will remain in effect (meaning this test can be used) for the duration of the COVID-19 declaration under Section 564(b)(1) of the Act, 21 U.S.C. section 360bbb-3(b)(1), unless the authorization is terminated or revoked.  Performed at [facility], [address] [place], [place] [address]      Time coordinating discharge: Over 30  minutes  SIGNED:   Alvira Philips [place], DO  [facility] 11/27/2022, 12:02  PM

## 2022-11-27 NOTE — Progress Notes (Signed)
Ann Green  XFG:[id number] DOB: October 16, 1922 DOA: 11/10/2022 PCP: Ria Clock, MD    Brief Narrative:   Ann Green is a 89 y.o. female with past medical history significant for dementia, hypertension, hyperlipidemia presented to hospital with generalized weakness, fatigue and tiredness with episodes of confusion and not acting herself.  Patient lives at home and has been taken care of by caregivers.  Patient was initially prescribed Macrobid by her primary care physician and had taken 1 dose this but today she developed some slurred speech and some weakness in the left arm so she was brought to the hospital.  By the time she came to the hospital her weakness and slurred speech had improved.  Patient denies any nausea, vomiting, abdominal pain, fever or chills.  Denies any chest pain, shortness of breath, dyspnea.  Denies any syncope or lightheadedness.  Denies any sick contacts or recent travel.  Denies any runny nose, sore throat.  Denies any urinary urgency, frequency or dysuria.   Initial vitals in the ED was notable for elevated blood pressure with some bradycardia.  Patient did have some left foot cellulitis.  Does have a history of bradycardia at baseline.  Labs in the ED showed hemoglobin of 9.8 no leukocytosis.  Creatinine 0.8.  UA showed some white cells from 11/10/2022 done outside.  Urine culture was sent from the ED.  Patient was started on Rocephin IV and was considered for admission to the hospital for generalized weakness, confusion, possible cellulitis.   Interim History She started improved or hospitalization and was seen by PT and OT who recommended SNF.  She is being medically stable for discharge however her insurance authorization was denied and then appealed and the appeal for the insurance authorization is still pending and TOC stating that Insurance Form for Appeal needs to be completed by the Family. Now Appeal is canceled and awaiting SNF to pursue LTC  coverage.   Assessment & Plan:   Generalized weakness/Fatigue Patient presenting to ED with generalized weakness, fatigue likely multifactorial in the setting of UTI, dehydration and generalized decline from advanced age.  Patient currently living at home and being managed by a caretaker.  Completed antibiotic course for UTI, TSH, vitamin B12 low normal range and vitamin D levels normal.  Seen by PT who recommended SNF placement.  Unfortunately insurance declined SNF placement and previous hospitalist Dr. Grandville Silos engaged in the peer to peer review with MD at insurance company on 11/16/2022 in which SNF placement was declined again at that time.  Family have healed and now trying to seek long-term care placement. --Family attempting LTC coverage --continue supportive care --Medically stable for discharge once bed available, per TOC   Chest wall pain Patient noted to have some chest wall pain which was reproducible on palpation, improving clinically. EKG done with normal sinus rhythm with no ischemic changes.  Clinical improvement noted with Voltaren gel. --Continue Voltaren gel as needed.   Possible Acute Cystitis Patient noted to have been prescribed Macrobid in the outpatient setting prior to admission. Urine cultures done during this hospitalization with no growth.  Completed 3-day course of IV ceftriaxone.   Acute Metabolic Encephalopathy/Mild Confusion, improving  Felt likely secondary to infectious etiology of UTI in the setting of underlying dementia.  Completed antibiotic course as above.  Now appears to be at her typical baseline per family. --Supportive care   Concern for TIA with some focal weakness CT head without contrast with no acute intracranial  normalities on admission.  Per previous hospitalist, Dr. Louanne Belton, family did not want to pursue any further workup including MRI as patient had improved and imaging would not likely change current management. --Continue aspirin    Hypothyroidism TSH 0.443 -- Continue Levothyroxine 75 mcg p.o. daily  History of Dementia --Delirium precautions --Get up during the day --Encourage a familiar face to remain present throughout the day --Keep blinds open and lights on during daylight hours --Minimize the use of opioids/benzodiazepines --Zyprexa 2.5 mg p.o. daily --Effexor 225 mg p.o. daily --Remeron 7.5 mg p.o. nightly as needed agitation/insomnia    Dehydration AKI on CKD Stage 3a Metabolic Acidosis  Patient with a slight increase in her BUN/creatinine.  Likely secondary to decreased oral intake. --Cr 0.84>>1.25>>0.98>1.21>1.04>0.81 --NS at 75 mL/h  Abnormal LFTs: Improving Right upper quadrant ultrasound notable for gallbladder not seen suggestive of previous cholecystectomy versus contracted gallbladder obscured by bowel gas, proximal common bile duct slightly prominent measuring 6.1 mm with no dilation of intrahepatic bile ducts. Last Labs         Recent Labs  Lab 11/10/22 1334 11/22/22 0328 11/23/22 0325 11/24/22 0537 11/25/22 0548  AST 17 53* 51* 105* 58*  ALT 10 96* 87* 128* 92*       Normocytic Anemia Last Labs           Recent Labs  Lab 11/10/22 1334 11/11/22 0512 11/14/22 0502 11/22/22 0328 11/23/22 0325 11/24/22 0537 11/25/22 0548  HGB 9.8* 11.0* 10.1* 11.0* 12.0 12.1 10.8*  HCT 33.1* 36.2 33.2* 35.2* 37.3 38.3 34.8*  MCV 89.7 86.2 86.9 86.1 83.8 85.5 86.6    -- Continue to monitor for signs and symptoms of bleeding; no overt bleeding noted -- Repeat CBC in a.m.   Hypoalbuminemia -- Patient's Albumin is now gone from 3.0>3.5>3.1> 2.1>2.7 -- Continue to encourage increased oral intake -- CMP in the a.m.   DVT prophylaxis: enoxaparin (LOVENOX) injection 30 mg Start: 11/12/22 2200    Code Status: DNR Family Communication: Updated family present at bedside this morning  Disposition Plan:  Level of care: Med-Surg Status is: Inpatient Remains inpatient appropriate because:  Awaiting LTC placement    Consultants:  None  Procedures:  Right upper quadrant ultrasound  Antimicrobials:  Ceftriaxone 12/16 - 12/18   Subjective: Patient seen examined at bedside, resting comfortably.  Pleasantly confused.  Sleeping but easily arousable.  Family present.  Awaiting placement, medically stable for discharge once facility identified.  No other specific questions or concerns at this time.  Patient denies headache, no chest pain, no shortness of breath, no abdominal pain.  No acute events overnight reported by nursing staff.  Objective: Vitals:   11/26/22 2031 11/27/22 0433 11/27/22 0500 11/27/22 0829  BP: (!) 160/79 (!) 173/62  (!) 179/70  Pulse: 72 (!) 57  (!) 58  Resp: 18 16  18   Temp: (!) 97.4 F (36.3 C) 97.6 F (36.4 C)  (!) 97.4 F (36.3 C)  TempSrc: Oral Oral    SpO2: 96% 100%  100%  Weight:   60.3 kg   Height:        Intake/Output Summary (Last 24 hours) at 11/27/2022 0935 Last data filed at 11/27/2022 0657 Gross per 24 hour  Intake 539 ml  Output 400 ml  Net 139 ml   Filed Weights   11/25/22 0500 11/26/22 0353 11/27/22 0500  Weight: 60.8 kg 61.7 kg 60.3 kg    Examination:  Physical Exam: GEN: NAD, alert, pleasantly confused, elderly in appearance HEENT: NCAT,  PERRL, EOMI, sclera clear, MMM PULM: CTAB w/o wheezes/crackles, normal respiratory effort, on room air CV: RRR w/o M/G/R GI: abd soft, NTND, NABS MSK: no peripheral edema, moves all extremities independently NEURO: No focal neurological deficits appreciated PSYCH: normal mood/affect Integumentary: dry/intact, no rashes or wounds    Data Reviewed: I have personally reviewed following labs and imaging studies  CBC: Recent Labs  Lab 11/22/22 0328 11/23/22 0325 11/24/22 0537 11/25/22 0548 11/27/22 0511  WBC 5.8 7.2 5.3 5.7 5.5  NEUTROABS 3.8 5.7 3.1 3.6  --   HGB 11.0* 12.0 12.1 10.8* 10.6*  HCT 35.2* 37.3 38.3 34.8* 34.3*  MCV 86.1 83.8 85.5 86.6 86.8  PLT 246 262 254  226 578   Basic Metabolic Panel: Recent Labs  Lab 11/22/22 0328 11/23/22 0325 11/24/22 0537 11/25/22 0548 11/27/22 0511  NA 141 140 139 143 143  K 3.8 3.8 4.0 3.7 3.8  CL 112* 109 109 116* 115*  CO2 23 22 22  21* 23  GLUCOSE 82 109* 97 86 90  BUN 32* 26* 38* 40* 17  CREATININE 1.00 0.98 1.21* 1.04* 0.85  CALCIUM 9.0 9.2 8.5* 8.2* 8.2*  MG 1.8 2.3 2.1 1.8  --   PHOS 4.0 4.1 4.1 3.2  --    GFR: Estimated Creatinine Clearance: 30 mL/min (by C-G formula based on SCr of 0.85 mg/dL). Liver Function Tests: Recent Labs  Lab 11/22/22 0328 11/23/22 0325 11/24/22 0537 11/25/22 0548 11/27/22 0511  AST 53* 51* 105* 58* 28  ALT 96* 87* 128* 92* 50*  ALKPHOS 117 139* 147* 129* 119  BILITOT 0.5 0.5 0.6 0.6 0.6  PROT 6.3* 7.2 6.7 5.9* 6.3*  ALBUMIN 3.0* 3.5 3.1* 2.7* 2.9*   No results for input(s): "LIPASE", "AMYLASE" in the last 168 hours. No results for input(s): "AMMONIA" in the last 168 hours. Coagulation Profile: No results for input(s): "INR", "PROTIME" in the last 168 hours. Cardiac Enzymes: No results for input(s): "CKTOTAL", "CKMB", "CKMBINDEX", "TROPONINI" in the last 168 hours. BNP (last 3 results) No results for input(s): "PROBNP" in the last 8760 hours. HbA1C: No results for input(s): "HGBA1C" in the last 72 hours. CBG: No results for input(s): "GLUCAP" in the last 168 hours. Lipid Profile: No results for input(s): "CHOL", "HDL", "LDLCALC", "TRIG", "CHOLHDL", "LDLDIRECT" in the last 72 hours. Thyroid Function Tests: No results for input(s): "TSH", "T4TOTAL", "FREET4", "T3FREE", "THYROIDAB" in the last 72 hours. Anemia Panel: No results for input(s): "VITAMINB12", "FOLATE", "FERRITIN", "TIBC", "IRON", "RETICCTPCT" in the last 72 hours. Sepsis Labs: No results for input(s): "PROCALCITON", "LATICACIDVEN" in the last 168 hours.  Recent Results (from the past 240 hour(s))  Resp panel by RT-PCR (RSV, Flu A&B, Covid) Anterior Nasal Swab     Status: None   Collection  Time: 11/19/22 12:12 PM   Specimen: Anterior Nasal Swab  Result Value Ref Range Status   SARS Coronavirus 2 by RT PCR NEGATIVE NEGATIVE Final    Comment: (NOTE) SARS-CoV-2 target nucleic acids are NOT DETECTED.  The SARS-CoV-2 RNA is generally detectable in upper respiratory specimens during the acute phase of infection. The lowest concentration of SARS-CoV-2 viral copies this assay can detect is 138 copies/mL. A negative result does not preclude SARS-Cov-2 infection and should not be used as the sole basis for treatment or other patient management decisions. A negative result may occur with  improper specimen collection/handling, submission of specimen other than nasopharyngeal swab, presence of viral mutation(s) within the areas targeted by this assay, and inadequate number of viral copies(<138  copies/mL). A negative result must be combined with clinical observations, patient history, and epidemiological information. The expected result is Negative.  Fact Sheet for Patients:  EntrepreneurPulse.com.au  Fact Sheet for Healthcare Providers:  IncredibleEmployment.be  This test is no t yet approved or cleared by the [place] FDA and  has been authorized for detection and/or diagnosis of SARS-CoV-2 by FDA under an Emergency Use Authorization (EUA). This EUA will remain  in effect (meaning this test can be used) for the duration of the COVID-19 declaration under Section 564(b)(1) of the Act, 21 U.S.C.section 360bbb-3(b)(1), unless the authorization is terminated  or revoked sooner.       Influenza A by PCR NEGATIVE NEGATIVE Final   Influenza B by PCR NEGATIVE NEGATIVE Final    Comment: (NOTE) The Xpert Xpress SARS-CoV-2/FLU/RSV plus assay is intended as an aid in the diagnosis of influenza from Nasopharyngeal swab specimens and should not be used as a sole basis for treatment. Nasal washings and aspirates are unacceptable for Xpert Xpress  SARS-CoV-2/FLU/RSV testing.  Fact Sheet for Patients: EntrepreneurPulse.com.au  Fact Sheet for Healthcare Providers: IncredibleEmployment.be  This test is not yet approved or cleared by the [place] FDA and has been authorized for detection and/or diagnosis of SARS-CoV-2 by FDA under an Emergency Use Authorization (EUA). This EUA will remain in effect (meaning this test can be used) for the duration of the COVID-19 declaration under Section 564(b)(1) of the Act, 21 U.S.C. section 360bbb-3(b)(1), unless the authorization is terminated or revoked.     Resp Syncytial Virus by PCR NEGATIVE NEGATIVE Final    Comment: (NOTE) Fact Sheet for Patients: EntrepreneurPulse.com.au  Fact Sheet for Healthcare Providers: IncredibleEmployment.be  This test is not yet approved or cleared by the [place] FDA and has been authorized for detection and/or diagnosis of SARS-CoV-2 by FDA under an Emergency Use Authorization (EUA). This EUA will remain in effect (meaning this test can be used) for the duration of the COVID-19 declaration under Section 564(b)(1) of the Act, 21 U.S.C. section 360bbb-3(b)(1), unless the authorization is terminated or revoked.  Performed at [facility], [address] [place], [place] [address]          Radiology Studies: No results found.      Scheduled Meds:  amLODipine  5 mg Oral Daily   aspirin  81 mg Oral Daily   cholecalciferol  1,000 Units Oral Daily   docusate sodium  100 mg Oral BID   enoxaparin (LOVENOX) injection  30 mg Subcutaneous Q24H   ferrous sulfate  325 mg Oral Daily   fluticasone  1 spray Each Nare Daily   levothyroxine  75 mcg Oral Q0600   loratadine  10 mg Oral Daily   OLANZapine  2.5 mg Oral Daily   pantoprazole  40 mg Oral Daily   venlafaxine XR  150 mg Oral Daily   venlafaxine XR  75 mg Oral Daily   Continuous Infusions:  sodium  chloride 75 mL/hr at 11/27/22 0500     LOS: 15 days    Time spent: 48 minutes spent on chart review, discussion with nursing staff, consultants, updating family and interview/physical exam; more than 50% of that time was spent in counseling and/or coordination of care.    Raeleigh Guinn J [place] DO [facility] Available via Epic secure chat 7am-7pm After these hours, please refer to coverage provider listed on [email or web address] 11/27/2022, 9:35 AM

## 2022-11-27 NOTE — Plan of Care (Signed)
Patient id adequate for discharge to SNF via EMS.  Family aware of discharge plans.

## 2022-11-27 NOTE — Care Management Important Message (Signed)
Important Message  Patient Details  Name: KRISANNE LICH MRN: [id number] Date of Birth: 09-Apr-1922   Medicare Important Message Given:  Yes     Juliann Pulse A Tadhg Eskew 11/27/2022, 12:31 PM

## 2022-11-27 NOTE — TOC Transition Note (Signed)
Transition of Care [facility]) - CM/SW Discharge Note   Patient Details  Name: Ann Green MRN: [id number] Date of Birth: July 06, 1922  [facility]) CM/SW Contact:  Gerilyn Pilgrim, LCSW Phone Number: 11/27/2022, 12:06 PM   Clinical Narrative:   Patient will DC to: [facility] Anticipated DC date: 11/27/2022 Family notified:grandaughter Transport DU:[id number]   Per MD patient ready for DC to [facility]. RN, patient, patient's family, and facility notified of DC. Discharge Summary sent to facility. RN given number for report. DC packet on chart. Ambulance transport requested for patient. CSW signing off.       Final next level of care: Skilled Nursing Facility Barriers to Discharge: Barriers Resolved   Patient Goals and CMS Choice CMS Medicare.gov Compare Post Acute Care list provided to:: Patient Represenative (must comment) (grandaughter and son) Choice offered to / list presented to : [facility] POA / Guardian  Discharge Placement PASRR number recieved: 11/15/22 PASRR number recieved: 11/15/22 Existing PASRR number confirmed : 11/15/22          Patient [facility] bed at: [facility] Patient to be transferred to facility by: [facility] Name of family member notified: Grandaughter Patient and family notified of of transfer: 11/27/22  Discharge Plan and Services Additional resources added to the After Visit Summary for                                       Social Determinants of Health (SDOH) Interventions SDOH Screenings   Food Insecurity: No Food Insecurity (11/10/2022)  Housing: Low Risk  (11/10/2022)  Transportation Needs: No Transportation Needs (11/10/2022)  Utilities: Not At Risk (11/10/2022)  Tobacco Use: Low Risk  (11/10/2022)     Readmission Risk Interventions     No data to display

## 2022-11-27 NOTE — TOC Progression Note (Addendum)
Transition of Care [facility]) - Progression Note    Patient Details  Name: Ann Green MRN: [id number] Date of Birth: [birth year]  Transition of Care [facility]) CM/SW Contact  Gerilyn Pilgrim, LCSW Phone Number: 11/27/2022, 10:09 AM  Clinical Narrative:    SW spoke with grandaughter who states appeal for [facility] was approved Saturday, phone number [phone].She states it was not for [facility]. SW LVM with [facility] asking if they are able to admit with this information.   1/2 10:00 Per Tiffany at [facility] patient is good to come today. Notified tiffany about appeal. Tiffany states pt will go to room 513. SW to arrange transport.   1/2 12:00 SW received call from [facility] who stated they had auth for the pt and was unsure what had happened. SW followed back up with Tiffany at [facility] who stated they had auth to take the patient and planned to take the patient today. Unsure where the confusion came from but family is agreeable and per tiffany at [facility] they have auth for the patient to admit.     Expected Discharge Plan and Services         Expected Discharge Date: 11/15/22                                     Social Determinants of Health (SDOH) Interventions SDOH Screenings   Food Insecurity: No Food Insecurity (11/10/2022)  Housing: Low Risk  (11/10/2022)  Transportation Needs: No Transportation Needs (11/10/2022)  Utilities: Not At Risk (11/10/2022)  Tobacco Use: Low Risk  (11/10/2022)    Readmission Risk Interventions     No data to display

## 2022-12-18 ENCOUNTER — Emergency Department
Admission: EM | Admit: 2022-12-18 | Discharge: 2022-12-18 | Disposition: A | Discharge status: Skilled Nursing Facility | Payer: Medicare Other | Attending: Emergency Medicine | Admitting: Emergency Medicine

## 2022-12-18 ENCOUNTER — Other Ambulatory Visit: Payer: Self-pay

## 2022-12-18 ENCOUNTER — Encounter: Payer: Self-pay | Admitting: *Deleted

## 2022-12-18 ENCOUNTER — Emergency Department: Payer: Medicare Other

## 2022-12-18 DIAGNOSIS — S0990XA Unspecified injury of head, initial encounter: Secondary | ICD-10-CM | POA: Diagnosis present

## 2022-12-18 DIAGNOSIS — W19XXXA Unspecified fall, initial encounter: Secondary | ICD-10-CM

## 2022-12-18 DIAGNOSIS — S0101XA Laceration without foreign body of scalp, initial encounter: Secondary | ICD-10-CM | POA: Insufficient documentation

## 2022-12-18 DIAGNOSIS — W01198A Fall on same level from slipping, tripping and stumbling with subsequent striking against other object, initial encounter: Secondary | ICD-10-CM | POA: Diagnosis not present

## 2022-12-18 NOTE — ED Triage Notes (Signed)
Pt brought in via ems from [facility].  Pt was leaning forward in her wheelchair and fell.  Pt has laceration to top of head.  Bleeding controlled.  Pt alert.

## 2022-12-18 NOTE — ED Provider Notes (Addendum)
[facility] Provider Note  Patient Contact: 10:57 PM (approximate)   History   Fall   HPI  Ann Green is a 89 y.o. female who presents emergency department with family members after having a fall while at the long-term care facility that the patient resides in.  She was trying to lean out of her chair, fell and hit her head.  No loss of consciousness.  Acting at normal/baseline according to family.  Patient did sustain a small laceration to her scalp.  Bleeding was controlled direct pressure.  No other injuries or complaints at this time.  Patient denies any musculoskeletal pain, moving all 4 extremities appropriately.     Physical Exam   Triage Vital Signs: ED Triage Vitals  Enc Vitals Group     BP 12/18/22 2049 (!) 158/90     Pulse Rate 12/18/22 2049 89     Resp 12/18/22 2049 20     Temp 12/18/22 2049 98.5 F (36.9 C)     Temp Source 12/18/22 2049 Oral     SpO2 12/18/22 2049 95 %     Weight 12/18/22 2047 132 lb 4.4 oz (60 kg)     Height 12/18/22 2047 5' (1.524 m)     Head Circumference --      Peak Flow --      Pain Score 12/18/22 2047 5     Pain Loc --      Pain Edu? --      Excl. in GC? --     Most recent vital signs: Vitals:   12/18/22 2049  BP: (!) 158/90  Pulse: 89  Resp: 20  Temp: 98.5 F (36.9 C)  SpO2: 95%     General: Alert and in no acute distress. Eyes:  PERRL. EOMI. Head: Superficial laceration along the scalp identified.  Scabbing is already forming.  No bleeding.  Very superficial in nature.  Patient with no battle signs, raccoon eyes or serosanguineous fluid drainage from the ears or nares.  Neck: No stridor. No cervical spine tenderness to palpation.  Cardiovascular:  Good peripheral perfusion Respiratory: Normal respiratory effort without tachypnea or retractions. Lungs CTAB.  Musculoskeletal: Full range of motion to all extremities.  Neurologic:  No gross focal neurologic deficits are appreciated.  No acute  neurodeficits on exam Skin:   No rash noted Other:   ED Results / Procedures / Treatments   Labs (all labs ordered are listed, but only abnormal results are displayed) Labs Reviewed - No data to display   EKG     RADIOLOGY  I personally viewed, evaluated, and interpreted these images as part of my medical decision making, as well as reviewing the written report by the radiologist.  ED Provider Interpretation: No acute traumatic findings on CT scan of the head or neck.  CT Head Wo Contrast  Result Date: 12/18/2022 CLINICAL DATA:  Head trauma, minor (Age >= 65y); Facial trauma, blunt. Fall, scalp laceration EXAM: CT HEAD WITHOUT CONTRAST CT CERVICAL SPINE WITHOUT CONTRAST TECHNIQUE: Multidetector CT imaging of the head and cervical spine was performed following the standard protocol without intravenous contrast. Multiplanar CT image reconstructions of the cervical spine were also generated. RADIATION DOSE REDUCTION: This exam was performed according to the departmental dose-optimization program which includes automated exposure control, adjustment of the mA and/or kV according to patient size and/or use of iterative reconstruction technique. COMPARISON:  CT head 11/10/2022, CT cervical spine 05/24/2020 FINDINGS: CT HEAD FINDINGS Brain: Normal anatomic configuration. Parenchymal volume  loss is commensurate with the patient's age. Stable moderate periventricular white matter changes are present likely reflecting the sequela of small vessel ischemia. Stable remote right caudate body and basal ganglia lacunar infarcts no abnormal intra or extra-axial mass lesion or fluid collection. No abnormal mass effect or midline shift. No evidence of acute intracranial hemorrhage or infarct. Ventricular size is normal. Cerebellum unremarkable. Vascular: No asymmetric hyperdense vasculature at the skull base. Skull: Intact Sinuses/Orbits: Paranasal sinuses are clear. Orbits are unremarkable. Other: Mastoid air  cells and middle ear cavities are clear. High right frontal scalp soft tissue swelling. CT CERVICAL SPINE FINDINGS Alignment: 1-2 mm anterolisthesis C4-5 and C6-7 are likely degenerative in nature. Otherwise normal cervical lordosis. Skull base and vertebrae: Craniocervical alignment is normal. The atlantodental interval is not widened. No acute fracture of the cervical spine. Remote appearing anterior wedge compression fracture of T2 with approximately 30-40% loss of height noted. Ankylosis of the facet joints of C3-4 bilaterally. Soft tissues and spinal canal: No prevertebral fluid or swelling. No visible canal hematoma. Disc levels: There is intervertebral disc space narrowing and endplate remodeling throughout the cervical spine, most severe at C4-C7 in keeping with changes of advanced degenerative disc disease. Prevertebral soft tissues are not thickened on sagittal reformats. Spinal canal is widely patent. Multilevel uncovertebral and facet arthrosis results in multilevel moderate to severe neuroforaminal narrowing, most severe on the left at C3-C6. Upper chest: Negative. Other: None IMPRESSION: 1. No acute intracranial abnormality. No calvarial fracture. High right frontal scalp soft tissue swelling. 2. Stable senescent changes and remote lacunar infarcts. 3. No acute fracture or listhesis of the cervical spine. 4. Multilevel degenerative disc and degenerative joint disease resulting in multilevel moderate to severe neuroforaminal narrowing, most severe on the left at C3-C6. Electronically Signed   By: Fidela Salisbury M.D.   On: 12/18/2022 21:29   CT Cervical Spine Wo Contrast  Result Date: 12/18/2022 CLINICAL DATA:  Head trauma, minor (Age >= 65y); Facial trauma, blunt. Fall, scalp laceration EXAM: CT HEAD WITHOUT CONTRAST CT CERVICAL SPINE WITHOUT CONTRAST TECHNIQUE: Multidetector CT imaging of the head and cervical spine was performed following the standard protocol without intravenous contrast.  Multiplanar CT image reconstructions of the cervical spine were also generated. RADIATION DOSE REDUCTION: This exam was performed according to the departmental dose-optimization program which includes automated exposure control, adjustment of the mA and/or kV according to patient size and/or use of iterative reconstruction technique. COMPARISON:  CT head 11/10/2022, CT cervical spine 05/24/2020 FINDINGS: CT HEAD FINDINGS Brain: Normal anatomic configuration. Parenchymal volume loss is commensurate with the patient's age. Stable moderate periventricular white matter changes are present likely reflecting the sequela of small vessel ischemia. Stable remote right caudate body and basal ganglia lacunar infarcts no abnormal intra or extra-axial mass lesion or fluid collection. No abnormal mass effect or midline shift. No evidence of acute intracranial hemorrhage or infarct. Ventricular size is normal. Cerebellum unremarkable. Vascular: No asymmetric hyperdense vasculature at the skull base. Skull: Intact Sinuses/Orbits: Paranasal sinuses are clear. Orbits are unremarkable. Other: Mastoid air cells and middle ear cavities are clear. High right frontal scalp soft tissue swelling. CT CERVICAL SPINE FINDINGS Alignment: 1-2 mm anterolisthesis C4-5 and C6-7 are likely degenerative in nature. Otherwise normal cervical lordosis. Skull base and vertebrae: Craniocervical alignment is normal. The atlantodental interval is not widened. No acute fracture of the cervical spine. Remote appearing anterior wedge compression fracture of T2 with approximately 30-40% loss of height noted. Ankylosis of the facet joints of C3-4  bilaterally. Soft tissues and spinal canal: No prevertebral fluid or swelling. No visible canal hematoma. Disc levels: There is intervertebral disc space narrowing and endplate remodeling throughout the cervical spine, most severe at C4-C7 in keeping with changes of advanced degenerative disc disease. Prevertebral soft  tissues are not thickened on sagittal reformats. Spinal canal is widely patent. Multilevel uncovertebral and facet arthrosis results in multilevel moderate to severe neuroforaminal narrowing, most severe on the left at C3-C6. Upper chest: Negative. Other: None IMPRESSION: 1. No acute intracranial abnormality. No calvarial fracture. High right frontal scalp soft tissue swelling. 2. Stable senescent changes and remote lacunar infarcts. 3. No acute fracture or listhesis of the cervical spine. 4. Multilevel degenerative disc and degenerative joint disease resulting in multilevel moderate to severe neuroforaminal narrowing, most severe on the left at C3-C6. Electronically Signed   By: Fidela Salisbury M.D.   On: 12/18/2022 21:29    PROCEDURES:  Critical Care performed: No  Procedures   MEDICATIONS ORDERED IN ED: Medications - No data to display   IMPRESSION / MDM / [place] / ED COURSE  I reviewed the triage vital signs and the nursing notes.                                 Differential diagnosis includes, but is not limited to, fall, skull fracture, intracranial hemorrhage, scalp laceration, minor head injury  Patient's presentation is most consistent with acute presentation with potential threat to life or bodily function.   Patient's diagnosis is consistent with fall, scalp laceration.  Patient presents emergency department after sustaining a mechanical fall out of a wheelchair.  Patient fell and did hit her head.  No loss consciousness.  Acting at baseline according to the family who is present currently.  Patient has a superficial scalp laceration that does not require closure at this time.  Wound care instructions discussed with the patient and family.  Follow-up primary care as needed.  Return precautions discussed with the patient and family.. Patient is given ED precautions to return to the ED for any worsening or new symptoms.     FINAL CLINICAL IMPRESSION(S) / ED DIAGNOSES    Final diagnoses:  Fall, initial encounter  Laceration of scalp, initial encounter     Rx / DC Orders   ED Discharge Orders     None        Note:  This document was prepared using Dragon voice recognition software and may include unintentional dictation errors.   Darletta Moll, PA-C 12/18/22 Hewitt, Charline Bills, PA-C 12/18/22 2300    Naaman Plummer, MD 12/18/22 [phone]

## 2022-12-18 NOTE — ED Notes (Signed)
Called [facility] for transport to facility  

## 2023-03-14 ENCOUNTER — Emergency Department: Payer: Medicare Other

## 2023-03-14 ENCOUNTER — Other Ambulatory Visit: Payer: Self-pay

## 2023-03-14 ENCOUNTER — Emergency Department
Admission: EM | Admit: 2023-03-14 | Discharge: 2023-03-14 | Disposition: A | Discharge status: Skilled Nursing Facility | Payer: Medicare Other | Attending: Emergency Medicine | Admitting: Emergency Medicine

## 2023-03-14 DIAGNOSIS — W010XXA Fall on same level from slipping, tripping and stumbling without subsequent striking against object, initial encounter: Secondary | ICD-10-CM | POA: Diagnosis not present

## 2023-03-14 DIAGNOSIS — F0390 Unspecified dementia without behavioral disturbance: Secondary | ICD-10-CM | POA: Diagnosis not present

## 2023-03-14 DIAGNOSIS — S0101XA Laceration without foreign body of scalp, initial encounter: Secondary | ICD-10-CM | POA: Diagnosis not present

## 2023-03-14 DIAGNOSIS — S32010A Wedge compression fracture of first lumbar vertebra, initial encounter for closed fracture: Secondary | ICD-10-CM

## 2023-03-14 DIAGNOSIS — W19XXXA Unspecified fall, initial encounter: Secondary | ICD-10-CM

## 2023-03-14 DIAGNOSIS — M25552 Pain in left hip: Secondary | ICD-10-CM | POA: Diagnosis present

## 2023-03-14 DIAGNOSIS — S32018A Other fracture of first lumbar vertebra, initial encounter for closed fracture: Secondary | ICD-10-CM | POA: Insufficient documentation

## 2023-03-14 LAB — CBC
HCT: 41.5 % (ref 36.0–46.0)
Hemoglobin: 12.4 g/dL (ref 12.0–15.0)
MCH: 28.3 pg (ref 26.0–34.0)
MCHC: 29.9 g/dL — ABNORMAL LOW (ref 30.0–36.0)
MCV: 94.7 fL (ref 80.0–100.0)
Platelets: 221 10*3/uL (ref 150–400)
RBC: 4.38 MIL/uL (ref 3.87–5.11)
RDW: 14.5 % (ref 11.5–15.5)
WBC: 5.4 10*3/uL (ref 4.0–10.5)
nRBC: 0 % (ref 0.0–0.2)

## 2023-03-14 LAB — BASIC METABOLIC PANEL
Anion gap: 10 (ref 5–15)
BUN: 23 mg/dL (ref 8–23)
CO2: 23 mmol/L (ref 22–32)
Calcium: 8.9 mg/dL (ref 8.9–10.3)
Chloride: 108 mmol/L (ref 98–111)
Creatinine, Ser: 0.96 mg/dL (ref 0.44–1.00)
GFR, Estimated: 53 mL/min — ABNORMAL LOW (ref >60)
Glucose, Bld: 87 mg/dL (ref 70–99)
Potassium: 3.9 mmol/L (ref 3.5–5.1)
Sodium: 141 mmol/L (ref 135–145)

## 2023-03-14 MED ORDER — ACETAMINOPHEN 325 MG PO TABS
650.0000 mg | ORAL_TABLET | Freq: Once | ORAL | Status: AC
  Administered 2023-03-14: 650 mg via ORAL
  Filled 2023-03-14: qty 2

## 2023-03-14 NOTE — ED Notes (Signed)
Pt transported to Xray via stretcher

## 2023-03-14 NOTE — ED Notes (Signed)
Pt discharged back to [facility] via [facility]. No S/S of distress.

## 2023-03-14 NOTE — ED Provider Notes (Signed)
[facility] Provider Note    Event Date/Time   First MD Initiated Contact with Patient 03/14/23 [phone]     (approximate)   History   Fall injury   HPI  Ann Green is a 89 y.o. female with history of dementia who presents from [place] memory care status post mechanical fall.  Reports of left hip pain, possible head injury as well.  No blood thinners reported.  Patient denies injury to me     Physical Exam   Triage Vital Signs: ED Triage Vitals  Enc Vitals Group     BP 03/14/23 0810 (!) 173/72     Pulse Rate 03/14/23 0812 72     Resp 03/14/23 0810 18     Temp 03/14/23 0813 97.8 F (36.6 C)     Temp src --      SpO2 03/14/23 0812 100 %     Weight 03/14/23 0811 49.9 kg (110 lb)     Height --      Head Circumference --      Peak Flow --      Pain Score 03/14/23 0810 0     Pain Loc --      Pain Edu? --      Excl. in GC? --     Most recent vital signs: Vitals:   03/14/23 0812 03/14/23 0813  BP:    Pulse: 72   Resp:    Temp:  97.8 F (36.6 C)  SpO2: 100%      General: Awake, no distress.  Shallow laceration to the crown of the scalp, bleeding controlled CV:  Good peripheral perfusion.  No chest wall tenderness to palpation Resp:  Normal effort.  Clear to auscultation bilaterally Abd:  No distention.  Soft, nontender Other:  No vertebral tenderness to palpation, no pain with axial load on the right hip.  Normal range of motion of the upper extremities.  Some pain with axial load on the left hip.  Knee flexes without significant pain.     ED Results / Procedures / Treatments   Labs (all labs ordered are listed, but only abnormal results are displayed) Labs Reviewed  CBC - Abnormal; Notable for the following components:      Result Value   MCHC 29.9 (*)    All other components within normal limits  BASIC METABOLIC PANEL - Abnormal; Notable for the following components:   GFR, Estimated 53 (*)    All other components within  normal limits     EKG  ED ECG REPORT I, Jene Every, the attending physician, personally viewed and interpreted this ECG.  Date: 03/14/2023  Rhythm: normal sinus rhythm QRS Axis: normal Intervals: normal ST/T Wave abnormalities: Nonspecific changes noted Narrative Interpretation:     RADIOLOGY CT head viewed interpret by me, no acute normality    PROCEDURES:  Critical Care performed:   Procedures   MEDICATIONS ORDERED IN ED: Medications  acetaminophen (TYLENOL) tablet 650 mg (has no administration in time range)     IMPRESSION / MDM / ASSESSMENT AND PLAN / ED COURSE  I reviewed the triage vital signs and the nursing notes. Patient's presentation is most consistent with acute presentation with potential threat to life or bodily function.  Patient presents after mechanical fall as discussed above.  Evidence of mild head injury, CT imaging is reassuring, no need for suturing, shallow laceration on the crown of the scalp bleeding well-controlled  X-rays of the lumbar spine demonstrate compression fracture of  L1, neurologically intact and patient is ambulating well with good pain control  Evidence of pelvic fracture or hip fracture  Patient was able to ambulate with walker and appears to be at her baseline, no indication for admission, appropriate for discharge at this time        FINAL CLINICAL IMPRESSION(S) / ED DIAGNOSES   Final diagnoses:  Fall, initial encounter  Compression fracture of L1 vertebra, initial encounter     Rx / DC Orders   ED Discharge Orders     None        Note:  This document was prepared using Dragon voice recognition software and may include unintentional dictation errors.   Jene Every, MD 03/14/23 [phone]

## 2023-03-14 NOTE — Discharge Instructions (Addendum)
Patient has a compression fracture of her L1 vertebrae, this will typically heal on its own and she can take Tylenol for pain as needed.  Her CT head and cervical spine were reassuring, her knee x-ray was unremarkable.  Her hip x-ray was unremarkable.  She was able to ambulate well here with a walker

## 2023-03-14 NOTE — ED Triage Notes (Signed)
Pt to ED [facility] from [facility] for fall, tripped over walker. C/o left hip pain. Laceration to top of head. Bleeding controlled. No blood thinner use.   Pt not oriented to year.  Pt denies pain at this time.

## 2023-03-24 ENCOUNTER — Emergency Department: Payer: Medicare Other

## 2023-03-24 ENCOUNTER — Other Ambulatory Visit: Payer: Self-pay

## 2023-03-24 ENCOUNTER — Emergency Department
Admission: EM | Admit: 2023-03-24 | Discharge: 2023-03-25 | Disposition: A | Discharge status: Skilled Nursing Facility | Payer: Medicare Other | Attending: Emergency Medicine | Admitting: Emergency Medicine

## 2023-03-24 DIAGNOSIS — I10 Essential (primary) hypertension: Secondary | ICD-10-CM | POA: Insufficient documentation

## 2023-03-24 DIAGNOSIS — W19XXXA Unspecified fall, initial encounter: Secondary | ICD-10-CM | POA: Insufficient documentation

## 2023-03-24 DIAGNOSIS — M25552 Pain in left hip: Secondary | ICD-10-CM | POA: Insufficient documentation

## 2023-03-24 DIAGNOSIS — R0789 Other chest pain: Secondary | ICD-10-CM | POA: Insufficient documentation

## 2023-03-24 DIAGNOSIS — S0101XA Laceration without foreign body of scalp, initial encounter: Secondary | ICD-10-CM | POA: Diagnosis not present

## 2023-03-24 DIAGNOSIS — S0990XA Unspecified injury of head, initial encounter: Secondary | ICD-10-CM | POA: Diagnosis present

## 2023-03-24 LAB — COMPREHENSIVE METABOLIC PANEL
ALT: 13 U/L (ref 0–44)
AST: 21 U/L (ref 15–41)
Albumin: 3.5 g/dL (ref 3.5–5.0)
Alkaline Phosphatase: 101 U/L (ref 38–126)
Anion gap: 8 (ref 5–15)
BUN: 24 mg/dL — ABNORMAL HIGH (ref 8–23)
CO2: 24 mmol/L (ref 22–32)
Calcium: 9 mg/dL (ref 8.9–10.3)
Chloride: 107 mmol/L (ref 98–111)
Creatinine, Ser: 1.05 mg/dL — ABNORMAL HIGH (ref 0.44–1.00)
GFR, Estimated: 48 mL/min — ABNORMAL LOW (ref >60)
Glucose, Bld: 107 mg/dL — ABNORMAL HIGH (ref 70–99)
Potassium: 4.3 mmol/L (ref 3.5–5.1)
Sodium: 139 mmol/L (ref 135–145)
Total Bilirubin: 0.5 mg/dL (ref 0.3–1.2)
Total Protein: 6.9 g/dL (ref 6.5–8.1)

## 2023-03-24 LAB — CBC
HCT: 39.3 % (ref 36.0–46.0)
Hemoglobin: 12.2 g/dL (ref 12.0–15.0)
MCH: 28.8 pg (ref 26.0–34.0)
MCHC: 31 g/dL (ref 30.0–36.0)
MCV: 92.7 fL (ref 80.0–100.0)
Platelets: 217 10*3/uL (ref 150–400)
RBC: 4.24 MIL/uL (ref 3.87–5.11)
RDW: 14.5 % (ref 11.5–15.5)
WBC: 6.2 10*3/uL (ref 4.0–10.5)
nRBC: 0 % (ref 0.0–0.2)

## 2023-03-24 LAB — TROPONIN I (HIGH SENSITIVITY): Troponin I (High Sensitivity): 8 ng/L (ref <18)

## 2023-03-24 MED ORDER — ACETAMINOPHEN 500 MG PO TABS
1000.0000 mg | ORAL_TABLET | Freq: Once | ORAL | Status: AC
  Administered 2023-03-24: 1000 mg via ORAL
  Filled 2023-03-24: qty 2

## 2023-03-24 NOTE — ED Triage Notes (Signed)
Pt here via [facility] after a witnessed fall. Pt states she does not remember what happened before her fall. Pt hit her head and is on blood thinners. Pt denies pain.

## 2023-03-24 NOTE — ED Provider Notes (Signed)
[facility] Provider Note    Event Date/Time   First MD Initiated Contact with Patient 03/24/23 2022     (approximate)   History   Fall   HPI  Ann Green is a 89 y.o. female with past medical history of dementia who presents after a fall.  Patient cannot tell me exactly why she fell thinks that she lost her balance.  She fell backward which is typical of her falls.  Her son who is at bedside that she falls frequently is typically when she does not use her walker.  Apparently she was in a friend's room at [facility] when she fell.  She did not lose consciousness.  Patient endorses pain in the left leg as well as pain in the center of her chest related to the fall.  Says she did not have any pain prior to this.  Pain is worse when she changes position.  Denies dyspnea denies headache vision change.  She is not on blood thinners.     Past Medical History:  Diagnosis Date   Thyroid disease     Patient Active Problem List   Diagnosis Date Noted   Weakness 11/15/2022   UTI (urinary tract infection) 11/10/2022   Thyroid disease 11/10/2022   Essential hypertension 11/10/2022   Hyperlipidemia 11/10/2022   Acute metabolic encephalopathy 11/10/2022   Generalized weakness 11/10/2022     Physical Exam  Triage Vital Signs: ED Triage Vitals [03/24/23 1734]  Enc Vitals Group     BP (!) 158/61     Pulse Rate 82     Resp 16     Temp 98.3 F (36.8 C)     Temp Source Oral     SpO2 100 %     Weight 110 lb 0.2 oz (49.9 kg)     Height 5' (1.524 m)     Head Circumference      Peak Flow      Pain Score 3     Pain Loc      Pain Edu?      Excl. in GC?     Most recent vital signs: Vitals:   03/24/23 1734  BP: (!) 158/61  Pulse: 82  Resp: 16  Temp: 98.3 F (36.8 C)  SpO2: 100%     General: Awake, no distress.  CV:  Good peripheral perfusion.  Resp:  Normal effort.  Abd:  No distention.  Neuro:             Awake, Alert, Oriented x 3   Other:  Small area of dried blood with very superficial laceration to the posterior occiput no palpable skull fracture No midline C-spine tenderness patient able to range neck Mild anterior chest wall tenderness no crepitus no signs of trauma No focal C, T or L-spine tenderness Patient is mild tenderness over the left hip but is able to range without difficulty No significant tenderness or deformity of the thigh knee or calf on the left Abdomen is soft and nontender   ED Results / Procedures / Treatments  Labs (all labs ordered are listed, but only abnormal results are displayed) Labs Reviewed  COMPREHENSIVE METABOLIC PANEL - Abnormal; Notable for the following components:      Result Value   Glucose, Bld 107 (*)    BUN 24 (*)    Creatinine, Ser 1.05 (*)    GFR, Estimated 48 (*)    All other components within normal limits  CBC  TROPONIN I (HIGH  SENSITIVITY)     EKG  EKG reviewed interpreted myself shows sinus tachycardia with diffuse T wave inversions repolarization abnormality, long PR interval slight right axis deviation   RADIOLOGY I reviewed and interpreted the CT scan of the brain which does not show any acute intracranial process    PROCEDURES:  Critical Care performed: No  Procedures   MEDICATIONS ORDERED IN ED: Medications  acetaminophen (TYLENOL) tablet 1,000 mg (1,000 mg Oral Given 03/24/23 2043)     IMPRESSION / MDM / ASSESSMENT AND PLAN / ED COURSE  I reviewed the triage vital signs and the nursing notes.                              Patient's presentation is most consistent with acute presentation with potential threat to life or bodily function.  Differential diagnosis includes, but is not limited to, intracranial hemorrhage, C-spine fracture, hip fracture, musculoskeletal injury, syncope, mechanical fall  The patient is a 89 year old female with some underlying dementia who presents after a fall.  Started know if this was mechanical versus  syncope but it does sound mainly mechanical as patient did not lose consciousness she is denying any preceding lightheadedness but is not sure exactly why she fell.  Her son who is here says she does have frequent falls typically when she is not using her walker.  This was witnessed by a friend at [place].  Patient endorses some anterior chest wall discomfort as well as left hip pain.  On exam she does have a small area of dried blood and a very superficial laceration that will not require closure on the posterior occiput.  She has some mild chest wall tenderness but no crepitus equal breath sounds.  She has some mild left hip tenderness but is able to range it fully and was able to bear weight after negative x-rays so I have low suspicion for occult fracture.  She has no focal C, T or L-spine tenderness.  Did obtain CT of the head and C-spine which are negative for acute abnormality.  Chest x-ray does not show any obvious rib fracture or pneumothorax.  X-ray of the hip are negative. \ Labs were sent from triage including a CBC and BMP which are largely within normal limits.  An EKG was obtained from triage was has some diffuse ST depression which does look new today.  I did add on a troponin given this which is negative.  Patient's chest pain is very much not ischemic sounding as it only started after the fall is reproducible on exam and is worse with movement.  I have low suspicion for acute coronary syndrome at this time.  Did consider diagnosis of pulmonary embolism which could lead to both syncope and cause diffuse EKG changes however patient is saturating 100% and has no respiratory symptoms so feel that this is less likely.  Patient was able to ambulate using her walker.  Son is at bedside.  Will discharge back to [facility].       FINAL CLINICAL IMPRESSION(S) / ED DIAGNOSES   Final diagnoses:  Fall, initial encounter     Rx / DC Orders   ED Discharge Orders     None         Note:  This document was prepared using Dragon voice recognition software and may include unintentional dictation errors.   Georga Hacking, MD 03/24/23 [phone]

## 2023-03-24 NOTE — ED Triage Notes (Signed)
First Nurse Note:  Pt via EMS from [place]. Pt had a witnessed fall. Unknown what made her fall. Pt does take blood thinner with + head injury. Pt has 2 lac to the back of her head, bleeding controlled at this time. Pt is at her baseline per staff.  166/75 BP  99% on RA 79 HR  19 RR

## 2023-03-24 NOTE — Discharge Instructions (Addendum)
We did not find any acute injury in your head your neck or your hip.  You likely have muscle strain and possibly some bruising.  You can take Tylenol for pain.  Please make sure you are using your walker.

## 2023-03-31 ENCOUNTER — Other Ambulatory Visit: Payer: Self-pay

## 2023-03-31 ENCOUNTER — Emergency Department: Payer: Medicare Other

## 2023-03-31 ENCOUNTER — Emergency Department
Admission: EM | Admit: 2023-03-31 | Discharge: 2023-03-31 | Disposition: A | Discharge status: Skilled Nursing Facility | Payer: Medicare Other | Attending: Emergency Medicine | Admitting: Emergency Medicine

## 2023-03-31 DIAGNOSIS — W19XXXA Unspecified fall, initial encounter: Secondary | ICD-10-CM | POA: Diagnosis not present

## 2023-03-31 DIAGNOSIS — F0390 Unspecified dementia without behavioral disturbance: Secondary | ICD-10-CM | POA: Diagnosis not present

## 2023-03-31 DIAGNOSIS — S0990XA Unspecified injury of head, initial encounter: Secondary | ICD-10-CM | POA: Diagnosis present

## 2023-03-31 DIAGNOSIS — M25552 Pain in left hip: Secondary | ICD-10-CM | POA: Insufficient documentation

## 2023-03-31 MED ORDER — ACETAMINOPHEN 500 MG PO TABS
1000.0000 mg | ORAL_TABLET | Freq: Once | ORAL | Status: AC
  Administered 2023-03-31: 1000 mg via ORAL
  Filled 2023-03-31: qty 2

## 2023-03-31 NOTE — ED Notes (Signed)
First nurse note: Pt to ED from [facility] for witnessed fall, no LOC, no thinners Has been having moe frequent falls last 2 weeks Hx chronic knee pain, no new pain Pt is at baseline, hx dementia, oriented to self and place. Hx HTN. PERRL  EMS VS: 144/58, 98% RA, HR 83, 157 CBG

## 2023-03-31 NOTE — ED Provider Notes (Signed)
[facility] Provider Note    Event Date/Time   First MD Initiated Contact with Patient 03/31/23 1724     (approximate)   History   Fall   HPI  Ann Green is a 89 y.o. female who comes in from memory care unit with history of dementia.  Patient has had multiple similar falls in the past where she loses her balance.  She supposed to use a walker.  She did not lose consciousness.  There was concern that she might of hit her back of her head therefore sent in the emergency room for evaluation.  She was seen on 4/28 with similar who did get blood work at that time which was reassuring she denies any significant symptoms at this time.     Physical Exam   Triage Vital Signs: ED Triage Vitals  Enc Vitals Group     BP 03/31/23 1536 139/83     Pulse Rate 03/31/23 1536 79     Resp 03/31/23 1536 16     Temp 03/31/23 1536 98 F (36.7 C)     Temp Source 03/31/23 1536 Oral     SpO2 03/31/23 1535 96 %     Weight --      Height --      Head Circumference --      Peak Flow --      Pain Score 03/31/23 1536 0     Pain Loc --      Pain Edu? --      Excl. in GC? --     Most recent vital signs: Vitals:   03/31/23 1535 03/31/23 1536  BP:  139/83  Pulse:  79  Resp:  16  Temp:  98 F (36.7 C)  SpO2: 96%      General: Awake, no distress.  CV:  Good peripheral perfusion.  Resp:  Normal effort.  Abd:  No distention.  Other:  No obvious trauma to the back of the head.  No obvious cervical pain.  No chest wall pain.  No abdominal tenderness.  Maybe a little bit of left hip pain.   ED Results / Procedures / Treatments   Labs (all labs ordered are listed, but only abnormal results are displayed) Labs Reviewed - No data to display    RADIOLOGY I have reviewed the CT head personally interpreted and no evidence of any intracranial hemorrhage  PROCEDURES:  Critical Care performed: No  Procedures   MEDICATIONS ORDERED IN ED: Medications - No data  to display   IMPRESSION / MDM / ASSESSMENT AND PLAN / ED COURSE  I reviewed the triage vital signs and the nursing notes.   Patient's presentation is most consistent with acute presentation with potential threat to life or bodily function.   Patient comes in with concern for fall.  Differential was cervical fracture, intracranial hemorrhage  CT imaging ordered that was negative.  Patient was able to stand up and ambulate to the bathroom and walk back with her walker.  She does have a history of multiple falls due to her some unsteadiness which is at baseline.  We discussed x-ray of the hip given she did report a little bit of pain in the hip but given she is ambulatory they would like to hold off.  We discussed most likely contusion and can follow-up with her primary care doctor and return to symptoms are worsening or any other concerns    FINAL CLINICAL IMPRESSION(S) / ED DIAGNOSES  Final diagnoses:  Injury of head, initial encounter  Fall, initial encounter     Rx / DC Orders   ED Discharge Orders     None        Note:  This document was prepared using Dragon voice recognition software and may include unintentional dictation errors.   Concha Se, MD 03/31/23 [phone]

## 2023-03-31 NOTE — ED Notes (Signed)
Pt was able to ambulate. Wasn't the best walk. Pt seemed afraid of falling.

## 2023-03-31 NOTE — Discharge Instructions (Signed)
Workup was reassuring without any new findings.  Patient was ambulatory and therefore discharged back to facility

## 2023-03-31 NOTE — ED Triage Notes (Addendum)
Pt here from [facility] via EMS for mechanical fall today. Pt denies pain, LOC, no blood thinners besides aspirin. No bruising, bleeding or obvious deformity. Pt alert, oriented x4 at this time.

## 2023-03-31 NOTE — ED Notes (Addendum)
Called [facility] and talked to [place] to give discharge instructions. Pt will be going home with son and granddaughter to [place] instead of going home via EMS which [facility] is aware of.

## 2023-06-26 ENCOUNTER — Emergency Department
Admission: EM | Admit: 2023-06-26 | Discharge: 2023-06-27 | Disposition: A | Discharge status: Home / Self Care | Attending: Emergency Medicine | Admitting: Emergency Medicine

## 2023-06-26 ENCOUNTER — Other Ambulatory Visit: Payer: Self-pay

## 2023-06-26 ENCOUNTER — Emergency Department

## 2023-06-26 DIAGNOSIS — W07XXXA Fall from chair, initial encounter: Secondary | ICD-10-CM | POA: Diagnosis not present

## 2023-06-26 DIAGNOSIS — F0390 Unspecified dementia without behavioral disturbance: Secondary | ICD-10-CM | POA: Insufficient documentation

## 2023-06-26 DIAGNOSIS — W19XXXA Unspecified fall, initial encounter: Secondary | ICD-10-CM

## 2023-06-26 DIAGNOSIS — S0083XA Contusion of other part of head, initial encounter: Secondary | ICD-10-CM | POA: Insufficient documentation

## 2023-06-26 DIAGNOSIS — S0993XA Unspecified injury of face, initial encounter: Secondary | ICD-10-CM | POA: Diagnosis present

## 2023-06-26 NOTE — ED Provider Notes (Signed)
[facility] Provider Note    Event Date/Time   First MD Initiated Contact with Patient 06/26/23 [phone]     (approximate)   History   Fall (Fall from chair, -loc, -bt )   HPI  Ann Green is a 89 y.o. female  here with fall. History limited 2/2 dementia. Per report, pt fell forward out of a chair at her facility. No loc. She is aox1 at baseline and is at her baseline now. Denies any complaints. Specifically no chest or abd pain, no back pain, no hip pain. Remainder of history limited 2/2 dementia. No known recent med changes. She was at her baseline prior to and since the fall.       Physical Exam   Triage Vital Signs: ED Triage Vitals  Encounter Vitals Group     BP 06/26/23 2323 (!) 150/55     Systolic BP Percentile --      Diastolic BP Percentile --      Pulse Rate 06/26/23 2323 74     Resp 06/26/23 2323 16     Temp 06/26/23 2323 97.8 F (36.6 C)     Temp src --      SpO2 06/26/23 2319 97 %     Weight --      Height 06/26/23 2324 5' (1.524 m)     Head Circumference --      Peak Flow --      Pain Score --      Pain Loc --      Pain Education --      Exclude from Growth Chart --     Most recent vital signs: Vitals:   06/26/23 2319 06/26/23 2323  BP:  (!) 150/55  Pulse:  74  Resp:  16  Temp:  97.8 F (36.6 C)  SpO2: 97% 99%     General: Awake, no distress.  CV:  Good peripheral perfusion. RRR. Resp:  Normal work of breathing. Lungs clear. Abd:  No distention. No tenderness. Other:  Bruising noted to L forehead and left periorbital area. Superficial abrasion to bridge of left nose. No deformity. No step offs. No muscular TTP or bony TTP across bl UE and LE and chest.    ED Results / Procedures / Treatments   Labs (all labs ordered are listed, but only abnormal results are displayed) Labs Reviewed - No data to display   EKG    RADIOLOGY CT Head/C Spine: NACIA CT Cervical: Negative   I also independently reviewed and  agree with radiologist interpretations.   PROCEDURES:  Critical Care performed: No   MEDICATIONS ORDERED IN ED: Medications - No data to display   IMPRESSION / MDM / ASSESSMENT AND PLAN / ED COURSE  I reviewed the triage vital signs and the nursing notes.                              Differential diagnosis includes, but is not limited to, mechanical fall, SDH, cervical injury, occult nasal fx  Patient's presentation is most consistent with acute presentation with potential threat to life or bodily function.  The patient is on the cardiac monitor to evaluate for evidence of arrhythmia and/or significant heart rate changes  89 yo F here with mechanical fall. Imaging negative. Superficial abrasion on nose does not require sutures. Pt is otherwise at her baseline per family and facility. D/c with bacitracin to abrasion, return precautions.  FINAL CLINICAL IMPRESSION(S) / ED DIAGNOSES   Final diagnoses:  Fall, initial encounter  Contusion of face, initial encounter     Rx / DC Orders   ED Discharge Orders          Ordered    bacitracin ointment        06/27/23 0110             Note:  This document was prepared using Dragon voice recognition software and may include unintentional dictation errors.   Shaune Pollack, MD 06/27/23 0111

## 2023-06-26 NOTE — ED Triage Notes (Signed)
Pt fell from chair at SNF. Per staff -loc and - blood thinners. Lac on L side of nose with bleeding controlled. Swelling and redness around eye.  Pt denies pain. Oriented to self only which is baseline d/t dementia.

## 2023-06-27 MED ORDER — BACITRACIN ZINC 500 UNIT/GM EX OINT: TOPICAL_OINTMENT | CUTANEOUS | 0 refills | Status: DC

## 2023-06-27 NOTE — ED Notes (Signed)
[facility] called for transport to [facility]

## 2023-06-27 NOTE — Discharge Instructions (Signed)
Ann Green can have Tylenol 650 mg every 4-6 hours for pain  Apply antibiotic ointment to the nasal abrasion/nasal bridge 2-3 x daily until healed  Minimize glasses contact with the area until healed, or wear a bandage to prevent irritation of the skin

## 2023-12-28 DEATH — deceased
